# Patient Record
Sex: Female | Born: 1960 | Race: White | Hispanic: No | Smoking: Former smoker
Health system: Southern US, Community
[De-identification: ages and names within clinical notes are randomized; demographics above are authoritative.]

## PROBLEM LIST (undated history)

## (undated) DIAGNOSIS — F32A Depression, unspecified: Secondary | ICD-10-CM

## (undated) DIAGNOSIS — D473 Essential (hemorrhagic) thrombocythemia: Secondary | ICD-10-CM

## (undated) DIAGNOSIS — K589 Irritable bowel syndrome without diarrhea: Secondary | ICD-10-CM

## (undated) DIAGNOSIS — F419 Anxiety disorder, unspecified: Secondary | ICD-10-CM

## (undated) DIAGNOSIS — I480 Paroxysmal atrial fibrillation: Secondary | ICD-10-CM

## (undated) DIAGNOSIS — I10 Essential (primary) hypertension: Secondary | ICD-10-CM

## (undated) DIAGNOSIS — K219 Gastro-esophageal reflux disease without esophagitis: Secondary | ICD-10-CM

## (undated) DIAGNOSIS — K439 Ventral hernia without obstruction or gangrene: Secondary | ICD-10-CM

## (undated) DIAGNOSIS — J45909 Unspecified asthma, uncomplicated: Secondary | ICD-10-CM

## (undated) DIAGNOSIS — D75839 Thrombocytosis, unspecified: Secondary | ICD-10-CM

## (undated) DIAGNOSIS — F329 Major depressive disorder, single episode, unspecified: Secondary | ICD-10-CM

## (undated) DIAGNOSIS — K5792 Diverticulitis of intestine, part unspecified, without perforation or abscess without bleeding: Secondary | ICD-10-CM

## (undated) DIAGNOSIS — C642 Malignant neoplasm of left kidney, except renal pelvis: Secondary | ICD-10-CM

## (undated) HISTORY — PX: WISDOM TOOTH EXTRACTION: SHX21

## (undated) HISTORY — PX: LYMPH GLAND EXCISION: SHX13

## (undated) HISTORY — DX: Thrombocytosis, unspecified: D75.839

## (undated) HISTORY — PX: TUBAL LIGATION: SHX77

## (undated) HISTORY — DX: Essential (hemorrhagic) thrombocythemia: D47.3

## (undated) HISTORY — PX: REVISION OF SCAR: SHX2348

## (undated) HISTORY — DX: Malignant neoplasm of left kidney, except renal pelvis: C64.2

## (undated) HISTORY — DX: Paroxysmal atrial fibrillation: I48.0

---
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
## 1998-09-22 ENCOUNTER — Other Ambulatory Visit: Admission: RE | Admit: 1998-09-22 | Discharge: 1998-09-22 | Payer: Self-pay | Admitting: *Deleted

## 1999-06-11 ENCOUNTER — Encounter: Payer: Self-pay | Admitting: Emergency Medicine

## 1999-06-11 ENCOUNTER — Emergency Department (HOSPITAL_COMMUNITY): Admission: EM | Admit: 1999-06-11 | Discharge: 1999-06-11 | Payer: Self-pay | Admitting: *Deleted

## 1999-10-31 ENCOUNTER — Other Ambulatory Visit: Admission: RE | Admit: 1999-10-31 | Discharge: 1999-10-31 | Payer: Self-pay | Admitting: *Deleted

## 2000-02-02 ENCOUNTER — Encounter: Payer: Self-pay | Admitting: Family Medicine

## 2000-02-02 ENCOUNTER — Encounter: Admission: RE | Admit: 2000-02-02 | Discharge: 2000-02-02 | Payer: Self-pay | Admitting: Family Medicine

## 2001-01-02 ENCOUNTER — Other Ambulatory Visit: Admission: RE | Admit: 2001-01-02 | Discharge: 2001-01-02 | Payer: Self-pay | Admitting: *Deleted

## 2001-07-22 ENCOUNTER — Other Ambulatory Visit: Admission: RE | Admit: 2001-07-22 | Discharge: 2001-07-22 | Payer: Self-pay | Admitting: *Deleted

## 2001-08-03 ENCOUNTER — Emergency Department (HOSPITAL_COMMUNITY): Admission: EM | Admit: 2001-08-03 | Discharge: 2001-08-03 | Payer: Self-pay | Admitting: Emergency Medicine

## 2002-12-22 ENCOUNTER — Other Ambulatory Visit: Admission: RE | Admit: 2002-12-22 | Discharge: 2002-12-22 | Payer: Self-pay | Admitting: *Deleted

## 2005-01-26 ENCOUNTER — Other Ambulatory Visit: Admission: RE | Admit: 2005-01-26 | Discharge: 2005-01-26 | Payer: Self-pay | Admitting: Obstetrics and Gynecology

## 2005-12-05 ENCOUNTER — Encounter: Admission: RE | Admit: 2005-12-05 | Discharge: 2005-12-05 | Payer: Self-pay | Admitting: Obstetrics and Gynecology

## 2006-05-17 ENCOUNTER — Encounter (INDEPENDENT_AMBULATORY_CARE_PROVIDER_SITE_OTHER): Payer: Self-pay | Admitting: Specialist

## 2006-05-17 ENCOUNTER — Ambulatory Visit (HOSPITAL_COMMUNITY): Admission: RE | Admit: 2006-05-17 | Discharge: 2006-05-17 | Payer: Self-pay | Admitting: Obstetrics and Gynecology

## 2008-07-18 ENCOUNTER — Emergency Department (HOSPITAL_COMMUNITY): Admission: EM | Admit: 2008-07-18 | Discharge: 2008-07-18 | Payer: Self-pay | Admitting: Emergency Medicine

## 2010-12-23 NOTE — H&P (Signed)
NAMEARDELIA, Natalie Cobb                ACCOUNT NO.:  [id number]   MEDICAL RECORD NO.:  [id number]          PATIENT TYPE:  AMB   LOCATION:  SDC                           FACILITY:  [facility]   PHYSICIAN:  Guy Sandifer. Henderson Cloud, M.D. DATE OF BIRTH:  Oct 02, 1960   DATE OF ADMISSION:  05/17/2006  DATE OF DISCHARGE:                                HISTORY & PHYSICAL   CHIEF COMPLAINT:  Pelvic pain, irregular heavy menses.  Desires permanent  sterilization.   HISTORY OF PRESENT ILLNESS:  This patient is a 50 year old divorced white  female, G5, P2, having menses every 2-6 weeks.  She has right lower quadrant  pain almost every day, sometimes sharp and sometimes bothering her quite a  bit.  This has happened several years ago and has now been recurrent for the  last several weeks.  She also desires permanent sterilization.  Alternate  methods of contraception have been reviewed.  The patient is being admitted  for open laparoscopy, tubal ligation, hysteroscopy, D&C, possible  resectoscope.  Potential risks and complications have been reviewed  preoperatively.  The patient declined preoperative ultrasonography.   PAST MEDICAL HISTORY:  1. Irregular heartbeat.  2. Panic disorder.  3. 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  4. Chronic hypertension.   PAST SURGICAL HISTORY:  1. 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  2. Abdominal plastic procedure for lipoma.  3. Tonsillectomy.   OBSTETRIC HISTORY:  Vaginal delivery x2, miscarriage x3.   FAMILY HISTORY:  Heart attack in paternal grandfather, heart disease  paternal grandfather, brain tumor paternal grandmother, stroke in mother,  pernicious anemia in mother, hypothyroidism in mother.   MEDICATIONS:  1. Wellbutrin.  2. Prozac.  3. Nexium.  4. Hydrochlorothiazide.  5. Clonazepam.  6. Soma.   ALLERGIES:  COMPAZINE LEADING TO MUSCLE TWITCHING AND SULFA DRUGS LEADING TO  RASH.   SOCIAL HISTORY:  Denies tobacco, alcohol and drug abuse.   REVIEW OF  SYSTEMS:  NEUROLOGIC:  Denies headache.  CARDIOVASCULAR:  Denies  chest pain.  PULMONARY:  Denies shortness of breath.  GI:  Denies recent  changes in bowel habits.   PHYSICAL EXAMINATION:  VITAL SIGNS:  Height 5 feet, 3 inches, weight 151.8  pounds, blood pressure 120/78.  LUNGS:  Clear to auscultation.  HEART:  Regular rate and rhythm.  BACK:  Without CVA tenderness.  BREASTS:  Without mass, retraction or discharge.  ABDOMEN:  Soft and nontender without masses.  PELVIC:  Vulva, vagina and cervix without lesion.  The uterus is mobile and  nontender.  Left adnexa nontender without masses.  Right adnexa mildly  tender without palpable masses.  EXTREMITIES:  Grossly within normal limits.  NEUROLOGICAL:  Grossly within normal limits.   ASSESSMENT:  1. Pelvic pain.  2. Menometrorrhagia.  3. Desires permanent sterilization.   PLAN:  Open laparoscopy, bilateral tubal ligation, hysteroscopy, possible  resectoscope, dilatation and curettage.      Guy Sandifer Henderson Cloud, M.D.  Electronically Signed     JET/MEDQ  D:  05/11/2006  T:  05/11/2006  Job:  [id number]

## 2010-12-23 NOTE — Op Note (Signed)
Natalie Cobb, Natalie Cobb                ACCOUNT NO.:  [id number]   MEDICAL RECORD NO.:  [id number]          PATIENT TYPE:  AMB   LOCATION:  [facility]C                           FACILITY:  [facility]   PHYSICIAN:  Guy Sandifer. Henderson Cloud, M.D. DATE OF BIRTH:  [birth year]   DATE OF PROCEDURE:  05/17/2006  DATE OF DISCHARGE:                                 OPERATIVE REPORT   PREOPERATIVE DIAGNOSIS:  Menorrhagia, pelvic pain, desires permanent  sterilization.   POSTOPERATIVE DIAGNOSIS:  Menorrhagia, pelvic pain, desires permanent  sterilization.   PROCEDURE:  Laparoscopy with bilateral tubal ligation with Filshie clips,  lysis of adhesions, hysteroscopy, dilatation and curettage, 1% Xylocaine  paracervical block.   SURGEON:  Guy Sandifer. Henderson Cloud, M.D.   ANESTHESIA:  General endotracheal intubation.   SPECIMENS:  Endometrial curettings.   INPUT/OUTPUT:  Sorbitol distending media, 65 cc deficit with some of that  being on the floor.   ESTIMATED BLOOD LOSS:  Less than 50 cc.   INDICATIONS/CONSENT:  This patient is a 50 year old divorced white female,  G5, P2, with frequent heavy menses, pelvic pain, and desires permanent  sterilization.  Details have been dictated in the history and physical.  Laparoscopy, tubal ligation with Filshie clips, hysteroscopy, dilatation and  curettage has been discussed preoperatively.  Potential risks and  complications have been discussed preoperatively, including but not limited  to infection, bowel/bladder/ureteral damage, bleeding requiring transfusion  of blood products, possible transfusion reaction, HIV and hepatitis  acquisition, DVT/PE, pneumonia, recurrent pelvic pain, or irregular heavy  bleeding, the permanence of tubal ligation, contraceptive alternatives,  failure rate, and increased ectopic risk have also been reviewed.  All  questions are answered, and consent is signed on the chart.   FINDINGS:  There is an 8 mm polypoid-type mass on a narrow stalk at the very  top of the endocervical canal. The remainder of the endometrial cavity is  without abnormal structure.  Fallopian tube ostia are identified  bilaterally.  Abdominal:  The upper abdomen appears grossly normal.  The  uterus is 4-6 weeks in size.  Fallopian tubes are normal bilaterally.  The  right ovary has a single dense adhesion to the right pelvic side wall.  The  left ovary is very small.  Anterior posterior cul de sac is normal.   PROCEDURE:  Patient is taken to the operating room where she is identified,  placed in the dorsal supine position.  General anesthesia is induced via  endotracheal intubation.  She is then placed in the dorsal lithotomy  position, where she is prepped abdominally and vaginally.  The bladder is  straight-catheterized, and she is draped in a sterile fashion.  A bivalve  speculum is placed in the vagina, and the anterior cervical lip is injected  with 1% Xylocaine.  It is then grasped with a single-tooth tenaculum.  Paracervical block is placed at the 2, 4, 5, 7, 8, and 10 o'clock positions  with approximately 20 cc total 1% plain Xylocaine.  The cervix is gently  progressively dilated with a 27 dilator.  Diagnostic hysteroscope is placed  in the endocervical canal  and advanced under direct visualization using  sorbitol-distending medium.  The above findings are noted.  Hysteroscope is  withdrawn.  Sharp curettage is carried out.  The polyp is noted to be  obtained with this.  The hysteroscope is then reintroduced in the  endocervical canal, and careful inspection reveals that the polyp was  removed.  The hysteroscope is then removed.  The single-tooth tenaculum is  then replaced with a Hulka tenaculum, and attention is turned to the  abdomen.  The infraumbilical and suprapubic area is injected with 0.5% plain  Marcaine.  An infraumbilical incision is made.  Careful blunt dissection is  carried out to the fascia.  The fascia is grasped with Kocher clamps and   elevated.  It is carefully incised in the midline, and a small incision is  made.  The peritoneal cavity is carefully entered without difficulty.  This  is elevated well away from the bowel.  A 0 Vicryl suture is placed at each  angle of the incision at this point in the fascia.  The disposable Hasson  laparoscope is then placed in the peritoneal cavity and tied down with the 0  Vicryl sutures.  Pneumoperitoneum is induced, and the operative laparoscope  is placed.  The above findings are noted.  The operative laparoscope is  placed.  The above findings are noted.  A small suprapubic incision is made,  and a 5 mm disposable bladeless XL trocar sleeve is placed under direct  visualization without difficulty.  The above findings are noted.  The  adhesion of the right ovary is taken down sharply in a simple fashion.  Good  hemostasis is noted.  The right fallopian tube is identified from cornua to  fimbria.  A Filshie clip is placed on the proximal one-third of the tube.  A  similar procedure is carried out on the left tube.  After removing the  Filshie clip applicator, reinspection reveals the entire width of the  fallopian tube is within the clip.  The heel of the clip could be seen  through the mesosalpinx bilaterally.  Good hemostasis is noted all around.  Suprapubic trocar sleeve is removed, and the Hasson laparoscopic trocar  sleeve is also removed.  Then under excellent visualization, an elevation of  the fascia, well away from the underlying structures, the fascia is closed  with the 0 Vicryl suture.  The skin is closed with subcuticular 3-0 Vicryl  suture.  Dermabond is placed on both incisions.  Hulka tenaculum is removed.  Good hemostasis is noted.  All counts are correct.  Patient is awakened and  taken to the recovery room in stable condition.      Guy Sandifer Henderson Cloud, M.D.  Electronically Signed    JET/MEDQ  D:  05/17/2006  T:  05/18/2006  Job:  [id number]

## 2013-06-08 ENCOUNTER — Emergency Department (HOSPITAL_COMMUNITY)
Admission: EM | Admit: 2013-06-08 | Discharge: 2013-06-08 | Disposition: A | Discharge status: Home / Self Care | Payer: BC Managed Care – PPO | Attending: Emergency Medicine | Admitting: Emergency Medicine

## 2013-06-08 ENCOUNTER — Encounter (HOSPITAL_COMMUNITY): Payer: Self-pay | Admitting: Emergency Medicine

## 2013-06-08 DIAGNOSIS — Z79899 Other long term (current) drug therapy: Secondary | ICD-10-CM | POA: Insufficient documentation

## 2013-06-08 DIAGNOSIS — I10 Essential (primary) hypertension: Secondary | ICD-10-CM | POA: Insufficient documentation

## 2013-06-08 DIAGNOSIS — R040 Epistaxis: Secondary | ICD-10-CM | POA: Insufficient documentation

## 2013-06-08 DIAGNOSIS — Z8719 Personal history of other diseases of the digestive system: Secondary | ICD-10-CM | POA: Insufficient documentation

## 2013-06-08 DIAGNOSIS — Z87891 Personal history of nicotine dependence: Secondary | ICD-10-CM | POA: Insufficient documentation

## 2013-06-08 HISTORY — DX: Diverticulitis of intestine, part unspecified, without perforation or abscess without bleeding: K57.92

## 2013-06-08 HISTORY — DX: Essential (primary) hypertension: I10

## 2013-06-08 MED ORDER — PHENYLEPHRINE HCL 0.5 % NA SOLN
2.0000 [drp] | Freq: Once | NASAL | Status: DC
  Filled 2013-06-08: qty 15

## 2013-06-08 NOTE — ED Notes (Signed)
Pt's nose bleed has stopped. Multiple clots coughed up by pt. Pt shows no sign of distress at this time.

## 2013-06-08 NOTE — ED Provider Notes (Signed)
CSN: [id number]     Arrival date & time 06/08/13  1858 History   First MD Initiated Contact with Patient 06/08/13 2034     Chief Complaint  Patient presents with  . Hypertension  . Epistaxis    HPI Pt was seen at 2135. Per pt, c/o gradual onset and improvement in constant nosebleed that began approx 45 minutes PTA. Pt states she held pressure on her nose but was unable to stop the bleeding at home. Pt states she "turned the heat on in the house" for the past few days. Pt denies taking anticoagulants. Denies hx of excessive/easy bleeding or bruising. Denies trauma. Denies syncope, no CP/SOB.    Past Medical History  Diagnosis Date  . Hypertension   . Diverticulitis    Past Surgical History  Procedure Laterality Date  . Nephectomy    . Spleenectomy    . Tubal ligation      History  Substance Use Topics  . Smoking status: Former Games developer  . Smokeless tobacco: Not on file  . Alcohol Use: Yes     Comment: rarely    Review of Systems ROS: Statement: All systems negative except as marked or noted in the HPI; Constitutional: Negative for fever and chills. ; ; Eyes: Negative for eye pain, redness and discharge. ; ; ENMT: +nosebleed. Negative for ear pain, hoarseness, nasal congestion, sinus pressure and sore throat. ; ; Cardiovascular: Negative for chest pain, palpitations, diaphoresis, dyspnea and peripheral edema. ; ; Respiratory: Negative for cough, wheezing and stridor. ; ; Gastrointestinal: Negative for nausea, vomiting, diarrhea, abdominal pain, blood in stool, hematemesis, jaundice and rectal bleeding. . ; ; Genitourinary: Negative for dysuria, flank pain and hematuria. ; ; Musculoskeletal: Negative for back pain and neck pain. Negative for swelling and trauma.; ; Skin: Negative for pruritus, rash, abrasions, blisters, bruising and skin lesion.; ; Neuro: Negative for headache, lightheadedness and neck stiffness. Negative for weakness, altered level of consciousness , altered mental  status, extremity weakness, paresthesias, involuntary movement, seizure and syncope.       Allergies  Compazine; Meclizine; Sulfa antibiotics; and Thorazine  Home Medications   Current Outpatient Rx  Name  Route  Sig  Dispense  Refill  . albuterol (PROAIR HFA) 108 (90 BASE) MCG/ACT inhaler   Inhalation   Inhale 2 puffs into the lungs every 6 (six) hours as needed for wheezing or shortness of breath.         Marland Kitchen buPROPion (WELLBUTRIN XL) 150 MG 24 hr tablet   Oral   Take 150 mg by mouth every evening.         . Cholecalciferol (VITAMIN D-3) 5000 UNITS TABS   Oral   Take by mouth every evening.         . clonazePAM (KLONOPIN) 1 MG tablet   Oral   Take 1 mg by mouth 2 (two) times daily.         Marland Kitchen dicyclomine (BENTYL) 20 MG tablet   Oral   Take 20 mg by mouth daily as needed (for stomach pain).         . fish oil-omega-3 fatty acids 1000 MG capsule   Oral   Take 2 g by mouth every evening.         Marland Kitchen FLUoxetine (PROZAC) 10 MG tablet   Oral   Take 10 mg by mouth every evening.         Marland Kitchen ibuprofen (ADVIL,MOTRIN) 200 MG tablet   Oral   Take 400 mg by  mouth every 6 (six) hours as needed for pain.         . metoprolol tartrate (LOPRESSOR) 25 MG tablet   Oral   Take 25 mg by mouth 2 (two) times daily.         . pantoprazole (PROTONIX) 40 MG tablet   Oral   Take 40 mg by mouth 2 (two) times daily.         . simvastatin (ZOCOR) 40 MG tablet   Oral   Take 40 mg by mouth every evening.          BP 156/77  Pulse 71  Temp(Src) 97.7 F (36.5 C) (Oral)  Resp 18  Ht 5\' 3"  (1.6 m)  Wt 190 lb (86.183 kg)  BMI 33.67 kg/m2  SpO2 97% Physical Exam 2140: Physical examination:  Nursing notes reviewed; Vital signs and O2 SAT reviewed;  Constitutional: Well developed, Well nourished, Well hydrated, In no acute distress; Head:  Normocephalic, atraumatic; Eyes: EOMI, PERRL, No scleral icterus; ENMT: TM's clear bilat. No active epistaxis. +dried blood right  anterior nares. Nasal mucosa friable. No blood in posterior pharynx. Mouth and pharynx normal, Mucous membranes moist; Neck: Supple, Full range of motion, No lymphadenopathy; Cardiovascular: Regular rate and rhythm, No murmur, rub, or gallop; Respiratory: Breath sounds clear & equal bilaterally, No rales, rhonchi, wheezes.  Speaking full sentences with ease, Normal respiratory effort/excursion; Chest: Nontender, Movement normal; Abdomen: Soft, Nontender, Nondistended, Normal bowel sounds; Genitourinary: No CVA tenderness; Extremities: Pulses normal, No tenderness, No edema, No calf edema or asymmetry.; Neuro: AA&Ox3, Major CN grossly intact.  Speech clear. Climbs on and off stretcher easily by herself. Gait steady.  No gross focal motor or sensory deficits in extremities.; Skin: Color normal, Warm, Dry.   ED Course  Procedures   EKG Interpretation   None       MDM  MDM Reviewed: previous chart, nursing note and vitals Interpretation: labs     I-stat chem: Na 141, K 4.1, Cl 103, iCa 1.26, CO2 25, Glu 101, BUN 13, Cr 0.8, Hgb 13.9, Hct 41.   2230:  No further epistaxis since arrival to the ED. Pt states she wants to go home now. Dx and testing d/w pt and family.  Questions answered.  Verb understanding, agreeable to d/c home with outpt f/u.     Laray Anger, DO 06/11/13 1301

## 2013-06-08 NOTE — ED Notes (Signed)
Pt presents with hypertension and epitaxies x 45 min.  Denies pain at this time.

## 2013-06-08 NOTE — ED Notes (Signed)
Pt presents via EMS secondary to nose bleed and hypertension. Pt reports nose has been bleeding x 45 min, unable to stop the bleeding. Pt is holding pressure on nose.  Pt reports has been coughing up a lot of clots since nose bleeding started.  No active bleeding noted at this time.

## 2013-06-09 LAB — POCT I-STAT, CHEM 8
Chloride: 103 mEq/L (ref 96–112)
Creatinine, Ser: 0.8 mg/dL (ref 0.50–1.10)
HCT: 41 % (ref 36.0–46.0)
Hemoglobin: 13.9 g/dL (ref 12.0–15.0)
Potassium: 4.1 mEq/L (ref 3.5–5.1)
Sodium: 141 mEq/L (ref 135–145)
TCO2: 25 mmol/L (ref 0–100)

## 2013-10-15 ENCOUNTER — Emergency Department (HOSPITAL_COMMUNITY): Payer: BC Managed Care – PPO

## 2013-10-15 ENCOUNTER — Encounter (HOSPITAL_COMMUNITY): Payer: Self-pay | Admitting: Emergency Medicine

## 2013-10-15 ENCOUNTER — Emergency Department (HOSPITAL_COMMUNITY)
Admission: EM | Admit: 2013-10-15 | Discharge: 2013-10-15 | Disposition: A | Discharge status: Home / Self Care | Payer: BC Managed Care – PPO | Attending: Emergency Medicine | Admitting: Emergency Medicine

## 2013-10-15 DIAGNOSIS — W1809XA Striking against other object with subsequent fall, initial encounter: Secondary | ICD-10-CM | POA: Insufficient documentation

## 2013-10-15 DIAGNOSIS — W108XXA Fall (on) (from) other stairs and steps, initial encounter: Secondary | ICD-10-CM | POA: Insufficient documentation

## 2013-10-15 DIAGNOSIS — S82851A Displaced trimalleolar fracture of right lower leg, initial encounter for closed fracture: Secondary | ICD-10-CM

## 2013-10-15 DIAGNOSIS — Z7982 Long term (current) use of aspirin: Secondary | ICD-10-CM | POA: Insufficient documentation

## 2013-10-15 DIAGNOSIS — Z8719 Personal history of other diseases of the digestive system: Secondary | ICD-10-CM | POA: Insufficient documentation

## 2013-10-15 DIAGNOSIS — Y9389 Activity, other specified: Secondary | ICD-10-CM | POA: Insufficient documentation

## 2013-10-15 DIAGNOSIS — I10 Essential (primary) hypertension: Secondary | ICD-10-CM | POA: Insufficient documentation

## 2013-10-15 DIAGNOSIS — S0990XA Unspecified injury of head, initial encounter: Secondary | ICD-10-CM | POA: Insufficient documentation

## 2013-10-15 DIAGNOSIS — Z87891 Personal history of nicotine dependence: Secondary | ICD-10-CM | POA: Insufficient documentation

## 2013-10-15 DIAGNOSIS — Z79899 Other long term (current) drug therapy: Secondary | ICD-10-CM | POA: Insufficient documentation

## 2013-10-15 DIAGNOSIS — E669 Obesity, unspecified: Secondary | ICD-10-CM | POA: Insufficient documentation

## 2013-10-15 DIAGNOSIS — S82853A Displaced trimalleolar fracture of unspecified lower leg, initial encounter for closed fracture: Secondary | ICD-10-CM | POA: Insufficient documentation

## 2013-10-15 DIAGNOSIS — Y929 Unspecified place or not applicable: Secondary | ICD-10-CM | POA: Insufficient documentation

## 2013-10-15 MED ORDER — PROPOFOL 10 MG/ML IV BOLUS
INTRAVENOUS | Status: AC
  Filled 2013-10-15: qty 20

## 2013-10-15 MED ORDER — PROPOFOL 10 MG/ML IV BOLUS
50.0000 mg | Freq: Once | INTRAVENOUS | Status: AC
  Administered 2013-10-15: 50 mg via INTRAVENOUS
  Filled 2013-10-15: qty 20

## 2013-10-15 MED ORDER — HYDROMORPHONE HCL PF 1 MG/ML IJ SOLN
1.0000 mg | Freq: Once | INTRAMUSCULAR | Status: AC
  Administered 2013-10-15: 1 mg via INTRAVENOUS
  Filled 2013-10-15: qty 1

## 2013-10-15 MED ORDER — ONDANSETRON HCL 4 MG/2ML IJ SOLN
4.0000 mg | Freq: Once | INTRAMUSCULAR | Status: AC
  Administered 2013-10-15: 4 mg via INTRAVENOUS
  Filled 2013-10-15: qty 2

## 2013-10-15 MED ORDER — FENTANYL CITRATE 0.05 MG/ML IJ SOLN
50.0000 ug | Freq: Once | INTRAMUSCULAR | Status: AC
  Administered 2013-10-15: 50 ug via INTRAVENOUS
  Filled 2013-10-15: qty 2

## 2013-10-15 MED ORDER — OXYCODONE-ACETAMINOPHEN 5-325 MG PO TABS: 2.0000 | ORAL_TABLET | ORAL | Status: DC | PRN

## 2013-10-15 MED ORDER — PROPOFOL 10 MG/ML IV BOLUS
INTRAVENOUS | Status: AC | PRN
  Administered 2013-10-15: 50 ug via INTRAVENOUS

## 2013-10-15 NOTE — ED Provider Notes (Signed)
CSN: [id number]     Arrival date & time 10/15/13  1350 History   First MD Initiated Contact with Patient 10/15/13 1433     Chief Complaint  Patient presents with  . Fall  . Ankle Injury     (Consider location/radiation/quality/duration/timing/severity/associated sxs/prior Treatment) HPI  53 year old female with history of hypertension who presents for evaluation of recent fall and right ankle injury. Patient reports she was vacuuming the stairs, missed step, fell backward down 5 steps hitting her head against a hard wood floor. Chief complaint of acute onset of pain to her right ankle, unable to move the ankle. She noticed obvious dislocation to R ankle. Incident happened about 2 hours ago. She was brought here via EMS on spine board and c-collar. She received 10 mg of morphine and 4 mg of Zofran  prior to arrival. She was found to be neurovascularly intact on initial exam by EMS with intact pulses and sensation to the right ankle and foot. She complains of mild tenderness to the back of her head but denies any shoulder, chest, back, abdomen, hip, or knee pain. The pain is currently 10 out of 10. She denies any prior injury to the same ankle. She denies any recent alcohol use. She denies any loss of consciousness or any precipitating symptoms prior to fall.  Past Medical History  Diagnosis Date  . Hypertension   . Diverticulitis    Past Surgical History  Procedure Laterality Date  . Nephectomy    . Spleenectomy    . Tubal ligation     History reviewed. No pertinent family history. History  Substance Use Topics  . Smoking status: Former Research scientist (life sciences)  . Smokeless tobacco: Not on file  . Alcohol Use: Yes     Comment: rarely   OB History   Grav Para Term Preterm Abortions TAB SAB Ect Mult Living                 Review of Systems  Constitutional: Negative for fever.  Musculoskeletal: Positive for arthralgias.  Skin: Negative for rash and wound.  Neurological: Negative for numbness.       Allergies  Compazine; Meclizine; Sulfa antibiotics; and Thorazine  Home Medications   Current Outpatient Rx  Name  Route  Sig  Dispense  Refill  . albuterol (PROAIR HFA) 108 (90 BASE) MCG/ACT inhaler   Inhalation   Inhale 2 puffs into the lungs every 6 (six) hours as needed for wheezing or shortness of breath.         Marland Kitchen amitriptyline (ELAVIL) 10 MG tablet   Oral   Take 10 mg by mouth 2 (two) times daily.         Marland Kitchen aspirin EC 81 MG tablet   Oral   Take 81 mg by mouth daily.         Marland Kitchen buPROPion (WELLBUTRIN XL) 150 MG 24 hr tablet   Oral   Take 150 mg by mouth every evening.         . Cholecalciferol (VITAMIN D-3) 5000 UNITS TABS   Oral   Take by mouth every evening.         . clonazePAM (KLONOPIN) 1 MG tablet   Oral   Take 1 mg by mouth 2 (two) times daily.         Marland Kitchen dicyclomine (BENTYL) 20 MG tablet   Oral   Take 20 mg by mouth daily as needed (for stomach pain).         . fish  oil-omega-3 fatty acids 1000 MG capsule   Oral   Take 2 g by mouth every evening.         Marland Kitchen ibuprofen (ADVIL,MOTRIN) 200 MG tablet   Oral   Take 400 mg by mouth every 6 (six) hours as needed for pain.         . metoprolol tartrate (LOPRESSOR) 25 MG tablet   Oral   Take 25 mg by mouth 2 (two) times daily.         . pantoprazole (PROTONIX) 40 MG tablet   Oral   Take 40 mg by mouth 2 (two) times daily.         . simvastatin (ZOCOR) 40 MG tablet   Oral   Take 40 mg by mouth every evening.          BP 154/99  Pulse 71  Temp(Src) 97.8 F (36.6 C) (Oral)  Resp 14  Ht 5\' 3"  (1.6 m)  Wt 200 lb (90.719 kg)  BMI 35.44 kg/m2  SpO2 97% Physical Exam  Nursing note and vitals reviewed. Constitutional: She is oriented to person, place, and time. She appears well-developed and well-nourished. She appears distressed (uncomfortable appearing, in pain).  Moderately obese  HENT:  No scalp tenderness or laceration noted. No hemotympanum, no septal hematoma, no  malocclusion, no midface tenderness  Neck:   c-collar in place, no midline cervical spine tenderness, crepitus, or step-off noted  Cardiovascular: Normal rate, regular rhythm and intact distal pulses.   Pulmonary/Chest: Effort normal. She exhibits no tenderness.  Abdominal: Soft. There is no tenderness.  Musculoskeletal: She exhibits tenderness (R ankle: obvious deformity with foot shifted laterally with tenderness to medial/lateral/posterior malleolar region, intact sensation, brisk cap refill, intact dorsallis pedis and posterior tibialis pulses.). She exhibits no edema.  Soft splint in place on RLE: Joint laxity to R ankle. Skin tenting noted to medial malleolar region.  Neurological: She is alert and oriented to person, place, and time.  Skin: Skin is warm. No rash noted.  Psychiatric: She has a normal mood and affect.    ED Course  Reduction of dislocation Date/Time: 10/15/2013 4:51 PM Performed by: Domenic Moras Authorized by: Domenic Moras Consent: Verbal consent obtained. written consent obtained. Risks and benefits: risks, benefits and alternatives were discussed Consent given by: patient Patient understanding: patient states understanding of the procedure being performed Patient consent: the patient's understanding of the procedure matches consent given Procedure consent: procedure consent matches procedure scheduled Relevant documents: relevant documents present and verified Test results: test results available and properly labeled Site marked: the operative site was marked Imaging studies: imaging studies available Patient identity confirmed: verbally with patient and arm band Time out: Immediately prior to procedure a "time out" was called to verify the correct patient, procedure, equipment, support staff and site/side marked as required. Local anesthesia used: no Patient sedated: yes Sedatives: propofol Analgesia: hydromorphone Sedation start date/time: 10/15/2013 4:32  PM Sedation end date/time: 10/15/2013 4:45 PM Vitals: Vital signs were monitored during sedation. Patient tolerance: Patient tolerated the procedure well with no immediate complications. Comments: Reduction using traction/counter traction and repositioning.  Posterior and stirrup splint applied.     (including critical care time)  2:50 PM Pt has mechanical fall and suffered a suspected fracture dislocation of R ankle.  Is neurovascularly intact on exam.  No LOC, no scalp tenderness and no significant midline spine tenderness.  This patient does have a distracting injury I cannot clear C-spine using the NEXUS criteria. Will obtain head neck  CT scan.  3:35 PM i have consulted orthopedic surgeon Dr. Erlinda Hong, who will review pt's imaging.  Pt will likely need ORIF. We will attempt to reduce R ankle with conscious sedation.  Care discussed with Dr. Doy Mince  4:42 PM R ankle was successfully reduced with conscious sedation under the guidance of Dr. Reather Converse.  Pt is neurovascularly intact post reduction.  Will check postreduction xray and will consult Dr. Erlinda Hong.  Will order basic labs.    4:53 PM Dr. Erlinda Hong has been consulted who request to have pt f/u in his office tomorrow for further management.  Care discussed with Dr. Reather Converse who will dispo pt once imaging resulted.      Labs Review Labs Reviewed - No data to display Imaging Review Dg Knee 1-2 Views Right  10/15/2013   CLINICAL DATA Right ankle deformity, fall  EXAM RIGHT KNEE - 1-2 VIEW  COMPARISON None  FINDINGS AP and cross-table lateral views.  Diffuse osseous demineralization.  Minimal joint space narrowing right knee.  No acute fracture, dislocation or bone destruction.  No knee joint effusion.  IMPRESSION No acute osseous abnormalities.  SIGNATURE  Electronically Signed   By: Lavonia Dana M.D.   On: 10/15/2013 15:32   Dg Ankle 2 Views Right  10/15/2013   CLINICAL DATA Postreduction  EXAM RIGHT ANKLE - 2 VIEW  COMPARISON Radiography from today at 3:20  p.m.  FINDINGS Relocated tibiotalar joint. Medial malleolar and distal fibular fractures are also better aligned. The medial malleolar fracture remains displaced 50%, with widening of the ankle mortise. No new osseous abnormality seen.  IMPRESSION Relocated tibiotalar joint. Improved distal fibula and tibia fracture alignment.  SIGNATURE  Electronically Signed   By: Jorje Guild M.D.   On: 10/15/2013 18:09   Dg Ankle 2 Views Right  10/15/2013   CLINICAL DATA Right ankle deformity post fall today  EXAM RIGHT ANKLE - 2 VIEW  COMPARISON None  FINDINGS AP and cross-table lateral views.  Medial malleolar fracture with significant lateral displacement.  Oblique distal right fibular metadiaphyseal fracture with significant posterior and lateral displacement and apex medial and anterior angulation.  Lateral dislocation of the talus at the tibiotalar joint.  Questionable small posterior malleolar fracture on single view.  Bones appear demineralized.  Prior superior intact.  IMPRESSION Bimalleolar versus trimalleolar fracture-dislocation right ankle as above.  SIGNATURE  Electronically Signed   By: Lavonia Dana M.D.   On: 10/15/2013 15:34   Ct Head Wo Contrast  10/15/2013   CLINICAL DATA 53 year old who fell down approximately 5 stairs.  EXAM CT HEAD WITHOUT CONTRAST  CT CERVICAL SPINE WITHOUT CONTRAST  TECHNIQUE Multidetector CT imaging of the head and cervical spine was performed following the standard protocol without intravenous contrast. Multiplanar CT image reconstructions of the cervical spine were also generated.  COMPARISON CT head and cervical spine 07/18/2008.  FINDINGS CT HEAD FINDINGS  Ventricular system normal in size and appearance for age. No mass lesion. No midline shift. No acute hemorrhage or hematoma. No extra-axial fluid collections. No evidence of acute infarction. No focal brain parenchymal abnormalities.  No skull fractures or other focal osseous abnormalities involving the skull. Visualized  paranasal sinuses, bilateral mastoid air cells, and bilateral middle ear cavities well-aerated.  CT CERVICAL SPINE FINDINGS  No fractures identified involving the cervical spine. Slight reversal of the usual lordosis centered at C5-6. Anatomic alignment. No spinal stenosis. Disc space narrowing and endplate hypertrophic changes at C5-6 (severe), progressive since the prior examination. Facet joints intact throughout.  Coronal reformatted images demonstrate an intact craniocervical junction, intact C1-C2 articulation, intact dens, and intact lateral masses throughout. No gross disc protrusions on the soft tissue window images. Moderate bilateral foraminal stenoses at C5-6 due to uncinate hypertrophy.  IMPRESSION CT Head:  1.  Normal examination.  CT Cervical Spine:  1. No cervical spine fractures identified. 2. Severe degenerative disc disease and spondylosis at C5-6 with moderate bilateral foraminal stenoses at this level, progressive since 2009.  SIGNATURE  Electronically Signed   By: Evangeline Dakin M.D.   On: 10/15/2013 18:10     EKG Interpretation None      MDM   Final diagnoses:  Fall down stairs  Closed trimalleolar fracture of right ankle    BP 135/80  Pulse 67  Temp(Src) 97.8 F (36.6 C) (Oral)  Resp 17  Ht 5\' 3"  (1.6 m)  Wt 200 lb (90.719 kg)  BMI 35.44 kg/m2  SpO2 95%  I have reviewed nursing notes and vital signs. I personally reviewed the imaging tests through PACS system  I reviewed available ER/hospitalization records thought the EMR     Domenic Moras, PA-C 10/16/13 [place], PA-C 10/16/13 [place], PA-C 10/17/13 1507

## 2013-10-15 NOTE — ED Notes (Signed)
PA at bedside.

## 2013-10-15 NOTE — ED Notes (Signed)
Communication to EDP pain was unrelieved by prior meds.

## 2013-10-15 NOTE — ED Notes (Signed)
Communication with Ortho for Posterior and Stirrup Splint.

## 2013-10-15 NOTE — ED Notes (Signed)
53 yo female presents with Right Ankle deformity after vacuuming the stairs and fell backwards down 5 steps. Pt has no hx of LOC and is A/O x4. Right Ankle is positive for pulse and sensation. Deformity is evident but no break in the skin. Currently on Back board and stabilized. HX. 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 Received 4 mg Zofran and 10mg  total of Morphine via IV.   vITALS  146/81 HR 72 SAT 96%

## 2013-10-15 NOTE — ED Notes (Signed)
Pt off the floor. XRAY

## 2013-10-15 NOTE — Progress Notes (Signed)
Orthopedic Tech Progress Note Patient Details:  Natalie Cobb Apr 12, 1961 [id number]  Ortho Devices Type of Ortho Device: Crutches Ortho Device/Splint Interventions: Ordered;Adjustment   Braulio Bosch 10/15/2013, 6:29 PM

## 2013-10-15 NOTE — Discharge Instructions (Signed)
Please wear splint, use crutches, do not bear any weight on affected ankle and keep ankle elevated above your heart when sitting or lying to decrease swelling.  Take pain medication as prescribed.  Follow up with Dr. Erlinda Hong tomorrow morning by calling his office for appointment.  He is expecting your call.  Return to ER if you develop numbness or if you have other concerns.    Trimalleolar Fracture, Ankle, Adult, Displaced (ORIF) A trimalleolar fracture (break in bone) is three fractures in the lower bones of your leg that make up your ankle. These fractures are in the bone you feel as the bump on the outside of your ankle (fibula) and the bone that you feel as the bump on the inside of your ankle (tibia). Your fractures are displaced. This means the bones are not in their normal position and will not give a good result if they heal in that position. Because of this, surgery is required. This is called an open reduction and internal fixation (ORIF). Even with the best of care and perfect results this ankle may be more prone to be arthritis later due to damage of the cartilage lining the ankle joint which is not visible on x-ray. These fractures are diagnosed with x-rays. TREATMENT  You have fractures that would probably heal with disability, without surgery. Open reduction means that the area of the fracture is opened up to the vision of the surgeon and internal fixation means that a screw, pins or fixation device is used to hold the boney pieces in place. After surgery a short-leg cast or removable fracture boot is then applied from your toes to below your knee. This is generally left in place for about 5 to 6 weeks, during which time it is followed by your caregiver and x-rays may be taken to make sure the bones stay in place. LET YOUR CAREGIVER KNOW ABOUT:  Allergies  Medications taken including herbs, eye drops, over the counter medications, and creams  Use of steroids (by mouth or creams)  History of  bleeding or blood problems  A family history of anesthetic problems.  Previous problems with anesthetics or novocaine, including a family history of these problems.  Possibility of pregnancy, if this applies  History of blood clots (thrombophlebitis)  Previous surgery  Other health problems RISKS AND COMPLICATIONS All surgery is associated with risks. Some of these risks are:  Excessive bleeding  Infection  Post traumatic arthritis.  Failure to heal properly resulting in an unstable or arthritic ankle  Stiffness of ankle following repair  The need to have some of the hardware removed. BEFORE AND AFTER SURGERY Prior to surgery an IV (intravenous line connected to your vein for giving fluids) may be started and you will be given an anesthetic (this may be medicine and gas to make you go to sleep or you may receive a spinal anesthetic which will make your legs numb for the period of time necessary for your surgery). You may also be given a regional anesthetic such as a spinal or epidural block. After surgery, you will be taken to the recovery area where a nurse will monitor your progress. You may have a catheter (a long, narrow, hollow tube) in your bladder following surgery that helps you pass your water. If you stay in the hospital, when you are awake, stable, taking fluids well and without complications, you will be returned to your room. You will receive physical therapy and other care until you are doing well and  your caregiver feels it is safe for you to be transferred either to home or to an extended care facility. HOME CARE INSTRUCTIONS   You may resume normal diet and activities as directed or allowed.  Keep ice packs (a bag of ice wrapped in a towel) on the surgical area for twenty minutes, four times per day, for the first two days following surgery. Use the ice only if OK with your surgeon or caregiver.  Elevate your ankle above your heart as much as possible for the first  24-48 hours after the operation.  Change dressings if necessary or as directed.  If you have a plaster or fiberglass cast:  Do not try to scratch the skin under the cast using sharp or pointed objects.  Check the skin around the cast every day. You may put lotion on any red or sore areas.  Keep your cast dry and clean.  Do not put pressure on any part of your cast or splint until it is fully hardened.  Your cast or splint can be protected during bathing with a plastic bag. Do not lower the cast or splint into water.  Only take over-the-counter or prescription medicines for pain, discomfort, or fever as directed by your caregiver.  Use crutches as directed and do not exercise leg unless instructed.  These are not fractures to be taken lightly! If these bones become displaced and get out of position, it may eventually lead to arthritis and disability for the rest of your life. Problems often follow even the best of care. Follow the directions of your caregiver.  Keep appointments as directed.  Elevate your injured ankle above your heart as much as possible for the first 5-7 days.  If you are placed into a fracture boot after surgery only remove is as instructed by your caregiver. SEEK IMMEDIATE MEDICAL CARE IF:   Redness, swelling, numbness or increasing pain in the wound.  Pus coming from wound.  An unexplained oral temperature above 102 F (38.9 C), or as your caregiver suggests.  A bad smell coming from the wound or dressing.  A breaking open of the wound (edges not staying together) after sutures or staples have been removed.  Your skin or nails below the injury turn blue or gray, or feel cold or numb.  You develop severe pain under the cast or in your foot. Especially when someone else moves your toes. Follow all instructions given to you by your caregiver, make and keep follow up appointments, and use crutches as directed. If you do not have a window in your cast for  observing the wound, a discharge or minor bleeding may show up as a stain on the outside of your dressings, your cast or plaster splint. Report these findings to your caregiver. If you are given a fracture boot, minor bleeding on the dressing is common. This is not of major concern. Change the dressing as instructed by your caregiver. Document Released: 04/15/2002 Document Revised: 10/16/2011 Document Reviewed: 10/15/2007 [facility] Patient Information 2014 [place].

## 2013-10-15 NOTE — Progress Notes (Signed)
Orthopedic Tech Progress Note Patient Details:  Natalie Cobb [birth year] [id number]  Ortho Devices Type of Ortho Device: Stirrup splint;Post (short leg) splint;Ace wrap Ortho Device/Splint Interventions: Application   Cammer, Theodoro Parma 10/15/2013, 5:27 PM

## 2013-10-17 ENCOUNTER — Other Ambulatory Visit (HOSPITAL_COMMUNITY): Payer: Self-pay | Admitting: Orthopedic Surgery

## 2013-10-17 ENCOUNTER — Encounter (HOSPITAL_COMMUNITY): Payer: Self-pay | Admitting: *Deleted

## 2013-10-17 NOTE — ED Provider Notes (Signed)
Medical screening examination/treatment/procedure(s) were conducted as a shared visit with non-physician practitioner(s) or resident and myself. I personally evaluated the patient during the encounter and agree with the findings and plan unless otherwise indicated.  I have personally reviewed any xrays and/ or EKG's with the provider and I agree with interpretation.  Mechanical fall, slipped down 5 stairs injuring right ankle and head. No loc and no vomiting. No blood thinners. Mild headache. No unilateral weakness, chest, back or abd pain. Pain with rom right ankle. Deformity. Right ankle tender, skin tenting anterior, mild swelling, nv intact distal, eversion/ deformity to ankle joint, RRR, lungs clear, CNs intact, moves all ext equal except RLE due to pain.  With skin tenting/ ankle fx/ dislocation discussed r/ b of reduction.  Procedural sedation Performed by: Mariea Clonts Consent: Verbal consent obtained. Risks and benefits: risks, benefits and alternatives were discussed Required items: required blood products, implants, devices, and special equipment available Patient identity confirmed: arm band and provided demographic data Time out: Immediately prior to procedure a "time out" was called to verify the correct patient, procedure, equipment, support staff and site  Sedation type: moderate (conscious) sedation NPO time confirmed, risks discussed  Sedatives: propofol  Physician Time at Bedside: 15 min  Vitals: Vital signs were monitored during sedation. Cardiac Monitor, pulse oximeter Patient tolerance: Patient tolerated the procedure well with no immediate complications. Comments: Pt with uneventful recovered. Returned to pre-procedural sedation baseline  I personally was present for entire procedural sedation and assisted with reduction and splint placement.  SPLINT APPLICATION Authorized by: Mariea Clonts Consent: Verbal consent obtained. Risks and benefits: risks,  benefits and alternatives were discussed Consent given by: patient Splint applied by: myself and tech Location details: right ankle stirrup/ short posterior leg Splint type: orthoglass  Supplies used: ace wraps/ cotton Post-procedure: The splinted body part was neurovascularly unchanged following the procedure. Patient tolerance: Patient tolerated the procedure well with no immediate complications.  Pain improved in ED, fup with ortho discussed.    Mariea Clonts, MD 10/17/13 [phone]

## 2013-10-17 NOTE — Progress Notes (Signed)
10/17/13 1646  OBSTRUCTIVE SLEEP APNEA  Have you ever been diagnosed with sleep apnea through a sleep study? No  Do you snore loudly (loud enough to be heard through closed doors)?  1  Do you often feel tired, fatigued, or sleepy during the daytime? 1  Has anyone observed you stop breathing during your sleep? 1  Do you have, or are you being treated for high blood pressure? 1  BMI more than 35 kg/m2? 1  Age over 53 years old? 1  Gender: 0  Obstructive Sleep Apnea Score 6  Score 4 or greater  Results sent to PCP

## 2013-10-18 ENCOUNTER — Encounter (HOSPITAL_COMMUNITY)
Admission: RE | Disposition: A | Discharge status: Home / Self Care | Payer: Self-pay | Source: Ambulatory Visit | Attending: Orthopedic Surgery

## 2013-10-18 ENCOUNTER — Ambulatory Visit (HOSPITAL_COMMUNITY): Payer: BC Managed Care – PPO

## 2013-10-18 ENCOUNTER — Encounter (HOSPITAL_COMMUNITY): Payer: Self-pay | Admitting: *Deleted

## 2013-10-18 ENCOUNTER — Encounter (HOSPITAL_COMMUNITY): Payer: BC Managed Care – PPO | Admitting: Certified Registered Nurse Anesthetist

## 2013-10-18 ENCOUNTER — Ambulatory Visit (HOSPITAL_COMMUNITY): Payer: BC Managed Care – PPO | Admitting: Certified Registered Nurse Anesthetist

## 2013-10-18 ENCOUNTER — Ambulatory Visit (HOSPITAL_COMMUNITY)
Admission: RE | Admit: 2013-10-18 | Discharge: 2013-10-18 | Disposition: A | Discharge status: Home / Self Care | Payer: BC Managed Care – PPO | Source: Ambulatory Visit | Attending: Orthopedic Surgery | Admitting: Orthopedic Surgery

## 2013-10-18 DIAGNOSIS — K439 Ventral hernia without obstruction or gangrene: Secondary | ICD-10-CM | POA: Insufficient documentation

## 2013-10-18 DIAGNOSIS — W010XXA Fall on same level from slipping, tripping and stumbling without subsequent striking against object, initial encounter: Secondary | ICD-10-CM | POA: Insufficient documentation

## 2013-10-18 DIAGNOSIS — F3289 Other specified depressive episodes: Secondary | ICD-10-CM | POA: Insufficient documentation

## 2013-10-18 DIAGNOSIS — S82853A Displaced trimalleolar fracture of unspecified lower leg, initial encounter for closed fracture: Secondary | ICD-10-CM | POA: Insufficient documentation

## 2013-10-18 DIAGNOSIS — J45909 Unspecified asthma, uncomplicated: Secondary | ICD-10-CM | POA: Insufficient documentation

## 2013-10-18 DIAGNOSIS — Z905 Acquired absence of kidney: Secondary | ICD-10-CM | POA: Insufficient documentation

## 2013-10-18 DIAGNOSIS — K589 Irritable bowel syndrome without diarrhea: Secondary | ICD-10-CM | POA: Insufficient documentation

## 2013-10-18 DIAGNOSIS — Z87891 Personal history of nicotine dependence: Secondary | ICD-10-CM | POA: Insufficient documentation

## 2013-10-18 DIAGNOSIS — Z7982 Long term (current) use of aspirin: Secondary | ICD-10-CM | POA: Insufficient documentation

## 2013-10-18 DIAGNOSIS — K219 Gastro-esophageal reflux disease without esophagitis: Secondary | ICD-10-CM | POA: Insufficient documentation

## 2013-10-18 DIAGNOSIS — I10 Essential (primary) hypertension: Secondary | ICD-10-CM | POA: Insufficient documentation

## 2013-10-18 DIAGNOSIS — F410 Panic disorder [episodic paroxysmal anxiety] without agoraphobia: Secondary | ICD-10-CM | POA: Insufficient documentation

## 2013-10-18 DIAGNOSIS — F329 Major depressive disorder, single episode, unspecified: Secondary | ICD-10-CM | POA: Insufficient documentation

## 2013-10-18 DIAGNOSIS — S82851A Displaced trimalleolar fracture of right lower leg, initial encounter for closed fracture: Secondary | ICD-10-CM

## 2013-10-18 HISTORY — DX: Irritable bowel syndrome, unspecified: K58.9

## 2013-10-18 HISTORY — DX: Major depressive disorder, single episode, unspecified: F32.9

## 2013-10-18 HISTORY — PX: ORIF ANKLE FRACTURE: SHX5408

## 2013-10-18 HISTORY — DX: Unspecified asthma, uncomplicated: J45.909

## 2013-10-18 HISTORY — DX: Anxiety disorder, unspecified: F41.9

## 2013-10-18 HISTORY — DX: Gastro-esophageal reflux disease without esophagitis: K21.9

## 2013-10-18 HISTORY — DX: Depression, unspecified: F32.A

## 2013-10-18 HISTORY — DX: Ventral hernia without obstruction or gangrene: K43.9

## 2013-10-18 LAB — CBC
HCT: 40.1 % (ref 36.0–46.0)
HEMOGLOBIN: 13.7 g/dL (ref 12.0–15.0)
MCH: 31.4 pg (ref 26.0–34.0)
MCHC: 34.2 g/dL (ref 30.0–36.0)
MCV: 91.8 fL (ref 78.0–100.0)
Platelets: 406 10*3/uL — ABNORMAL HIGH (ref 150–400)
RBC: 4.37 MIL/uL (ref 3.87–5.11)
RDW: 14 % (ref 11.5–15.5)
WBC: 14 10*3/uL — ABNORMAL HIGH (ref 4.0–10.5)

## 2013-10-18 LAB — COMPREHENSIVE METABOLIC PANEL
ALT: 20 U/L (ref 0–35)
AST: 26 U/L (ref 0–37)
Albumin: 3.3 g/dL — ABNORMAL LOW (ref 3.5–5.2)
Alkaline Phosphatase: 91 U/L (ref 39–117)
BUN: 15 mg/dL (ref 6–23)
CO2: 23 mEq/L (ref 19–32)
Calcium: 9.8 mg/dL (ref 8.4–10.5)
Chloride: 99 mEq/L (ref 96–112)
Creatinine, Ser: 0.83 mg/dL (ref 0.50–1.10)
GFR calc non Af Amer: 80 mL/min — ABNORMAL LOW (ref >90)
GLUCOSE: 111 mg/dL — AB (ref 70–99)
Potassium: 4.3 mEq/L (ref 3.7–5.3)
Sodium: 138 mEq/L (ref 137–147)
Total Bilirubin: 1 mg/dL (ref 0.3–1.2)
Total Protein: 7.7 g/dL (ref 6.0–8.3)

## 2013-10-18 LAB — PROTIME-INR
INR: 1.06 (ref 0.00–1.49)
Prothrombin Time: 13.6 seconds (ref 11.6–15.2)

## 2013-10-18 LAB — APTT: aPTT: 30 seconds (ref 24–37)

## 2013-10-18 SURGERY — OPEN REDUCTION INTERNAL FIXATION (ORIF) ANKLE FRACTURE
Anesthesia: General | Site: Ankle | Laterality: Right

## 2013-10-18 MED ORDER — EPHEDRINE SULFATE 50 MG/ML IJ SOLN
INTRAMUSCULAR | Status: AC
  Filled 2013-10-18: qty 1

## 2013-10-18 MED ORDER — ONDANSETRON HCL 4 MG/2ML IJ SOLN
INTRAMUSCULAR | Status: DC | PRN
  Administered 2013-10-18: 4 mg via INTRAVENOUS

## 2013-10-18 MED ORDER — FENTANYL CITRATE 0.05 MG/ML IJ SOLN
INTRAMUSCULAR | Status: DC | PRN
  Administered 2013-10-18 (×2): 50 ug via INTRAVENOUS

## 2013-10-18 MED ORDER — LIDOCAINE HCL (CARDIAC) 20 MG/ML IV SOLN
INTRAVENOUS | Status: AC
  Filled 2013-10-18: qty 5

## 2013-10-18 MED ORDER — OXYCODONE HCL 5 MG PO TABS
5.0000 mg | ORAL_TABLET | Freq: Once | ORAL | Status: DC | PRN
Start: 2013-10-18 — End: 2013-10-18

## 2013-10-18 MED ORDER — ACETAMINOPHEN 325 MG PO TABS
ORAL_TABLET | ORAL | Status: AC
  Filled 2013-10-18: qty 2

## 2013-10-18 MED ORDER — ASPIRIN EC 325 MG PO TBEC: 325.0000 mg | DELAYED_RELEASE_TABLET | Freq: Every day | ORAL | Status: DC

## 2013-10-18 MED ORDER — HYDROMORPHONE HCL PF 1 MG/ML IJ SOLN
0.2500 mg | INTRAMUSCULAR | Status: DC | PRN
  Administered 2013-10-18 (×2): 0.5 mg via INTRAVENOUS

## 2013-10-18 MED ORDER — ONDANSETRON HCL 4 MG/2ML IJ SOLN: 4.0000 mg | Freq: Once | INTRAMUSCULAR | Status: DC | PRN

## 2013-10-18 MED ORDER — ACETAMINOPHEN 325 MG PO TABS
325.0000 mg | ORAL_TABLET | ORAL | Status: DC | PRN
  Administered 2013-10-18: 650 mg via ORAL

## 2013-10-18 MED ORDER — MIDAZOLAM HCL 2 MG/2ML IJ SOLN
INTRAMUSCULAR | Status: AC
  Filled 2013-10-18: qty 2

## 2013-10-18 MED ORDER — CEFAZOLIN SODIUM-DEXTROSE 2-3 GM-% IV SOLR
2.0000 g | INTRAVENOUS | Status: AC
  Administered 2013-10-18: 2 g via INTRAVENOUS
  Filled 2013-10-18: qty 50

## 2013-10-18 MED ORDER — ACETAMINOPHEN 160 MG/5ML PO SOLN
325.0000 mg | ORAL | Status: DC | PRN
  Filled 2013-10-18: qty 20.3

## 2013-10-18 MED ORDER — FENTANYL CITRATE 0.05 MG/ML IJ SOLN
INTRAMUSCULAR | Status: AC
  Filled 2013-10-18: qty 5

## 2013-10-18 MED ORDER — PROPOFOL 10 MG/ML IV BOLUS
INTRAVENOUS | Status: AC
  Filled 2013-10-18: qty 40

## 2013-10-18 MED ORDER — OXYCODONE HCL 5 MG/5ML PO SOLN: 5.0000 mg | Freq: Once | ORAL | Status: DC | PRN

## 2013-10-18 MED ORDER — KETOROLAC TROMETHAMINE 30 MG/ML IJ SOLN
INTRAMUSCULAR | Status: AC
  Filled 2013-10-18: qty 1

## 2013-10-18 MED ORDER — ROCURONIUM BROMIDE 50 MG/5ML IV SOLN
INTRAVENOUS | Status: AC
  Filled 2013-10-18: qty 1

## 2013-10-18 MED ORDER — ROPIVACAINE HCL 5 MG/ML IJ SOLN
INTRAMUSCULAR | Status: DC | PRN
  Administered 2013-10-18: 30 mL via PERINEURAL

## 2013-10-18 MED ORDER — MIDAZOLAM HCL 5 MG/5ML IJ SOLN
INTRAMUSCULAR | Status: DC | PRN
  Administered 2013-10-18 (×2): 1 mg via INTRAVENOUS

## 2013-10-18 MED ORDER — HYDROMORPHONE HCL PF 1 MG/ML IJ SOLN
INTRAMUSCULAR | Status: AC
  Administered 2013-10-18: 0.5 mg via INTRAVENOUS
  Filled 2013-10-18: qty 1

## 2013-10-18 MED ORDER — KETOROLAC TROMETHAMINE 30 MG/ML IJ SOLN
15.0000 mg | Freq: Once | INTRAMUSCULAR | Status: AC | PRN
  Administered 2013-10-18: 30 mg via INTRAVENOUS

## 2013-10-18 MED ORDER — LACTATED RINGERS IV SOLN
INTRAVENOUS | Status: DC | PRN
  Administered 2013-10-18 (×2): via INTRAVENOUS

## 2013-10-18 MED ORDER — ONDANSETRON HCL 4 MG/2ML IJ SOLN
INTRAMUSCULAR | Status: AC
  Filled 2013-10-18: qty 2

## 2013-10-18 MED ORDER — STERILE WATER FOR INJECTION IJ SOLN
INTRAMUSCULAR | Status: AC
  Filled 2013-10-18: qty 10

## 2013-10-18 MED ORDER — PROPOFOL 10 MG/ML IV BOLUS
INTRAVENOUS | Status: DC | PRN
  Administered 2013-10-18: 120 mg via INTRAVENOUS

## 2013-10-18 MED ORDER — LIDOCAINE HCL (CARDIAC) 20 MG/ML IV SOLN
INTRAVENOUS | Status: DC | PRN
  Administered 2013-10-18: 80 mg via INTRAVENOUS

## 2013-10-18 MED ORDER — SUCCINYLCHOLINE CHLORIDE 20 MG/ML IJ SOLN
INTRAMUSCULAR | Status: AC
  Filled 2013-10-18: qty 1

## 2013-10-18 MED ORDER — 0.9 % SODIUM CHLORIDE (POUR BTL) OPTIME
TOPICAL | Status: DC | PRN
  Administered 2013-10-18: 1000 mL

## 2013-10-18 SURGICAL SUPPLY — 57 items
BANDAGE ESMARK 6X9 LF (GAUZE/BANDAGES/DRESSINGS) ×1 IMPLANT
BIT DRILL 2.5X110 QC LCP DISP (BIT) ×3 IMPLANT
BIT DRILL CANN 2.7X625 NONSTRL (BIT) ×3 IMPLANT
BNDG COHESIVE 4X5 TAN STRL (GAUZE/BANDAGES/DRESSINGS) ×3 IMPLANT
BNDG ESMARK 6X9 LF (GAUZE/BANDAGES/DRESSINGS) ×3
BNDG GAUZE ELAST 4 BULKY (GAUZE/BANDAGES/DRESSINGS) ×3 IMPLANT
BNDG GAUZE STRTCH 6 (GAUZE/BANDAGES/DRESSINGS) IMPLANT
COVER SURGICAL LIGHT HANDLE (MISCELLANEOUS) ×3 IMPLANT
CUFF TOURNIQUET SINGLE 34IN LL (TOURNIQUET CUFF) IMPLANT
CUFF TOURNIQUET SINGLE 44IN (TOURNIQUET CUFF) IMPLANT
DRAPE C-ARM MINI 42X72 WSTRAPS (DRAPES) IMPLANT
DRAPE INCISE IOBAN 66X45 STRL (DRAPES) ×3 IMPLANT
DRAPE OEC MINIVIEW 54X84 (DRAPES) ×3 IMPLANT
DRAPE PROXIMA HALF (DRAPES) ×3 IMPLANT
DRAPE U-SHAPE 47X51 STRL (DRAPES) ×3 IMPLANT
DRSG ADAPTIC 3X8 NADH LF (GAUZE/BANDAGES/DRESSINGS) ×3 IMPLANT
DURAPREP 26ML APPLICATOR (WOUND CARE) ×3 IMPLANT
ELECT REM PT RETURN 9FT ADLT (ELECTROSURGICAL) ×3
ELECTRODE REM PT RTRN 9FT ADLT (ELECTROSURGICAL) ×1 IMPLANT
GLOVE BIOGEL PI IND STRL 9 (GLOVE) ×1 IMPLANT
GLOVE BIOGEL PI INDICATOR 9 (GLOVE) ×2
GLOVE SURG ORTHO 9.0 STRL STRW (GLOVE) ×3 IMPLANT
GOWN STRL REUS W/ TWL XL LVL3 (GOWN DISPOSABLE) ×2 IMPLANT
GOWN STRL REUS W/TWL XL LVL3 (GOWN DISPOSABLE) ×4
GUIDEWARE NON THREAD 1.25X150 (WIRE) ×6
GUIDEWIRE NON THREAD 1.25X150 (WIRE) ×2 IMPLANT
KIT BASIN OR (CUSTOM PROCEDURE TRAY) ×3 IMPLANT
KIT ROOM TURNOVER OR (KITS) ×3 IMPLANT
MANIFOLD NEPTUNE II (INSTRUMENTS) IMPLANT
NS IRRIG 1000ML POUR BTL (IV SOLUTION) ×3 IMPLANT
PACK ORTHO EXTREMITY (CUSTOM PROCEDURE TRAY) ×3 IMPLANT
PAD ARMBOARD 7.5X6 YLW CONV (MISCELLANEOUS) ×6 IMPLANT
PADDING CAST COTTON 6X4 STRL (CAST SUPPLIES) ×3 IMPLANT
PLATE LCP 3.5 1/3 TUB 8HX93 (Plate) ×3 IMPLANT
SCREW CANN 6XFT 40X4X2.7X (Screw) ×1 IMPLANT
SCREW CANNULATED 4.0X40 (Screw) ×2 IMPLANT
SCREW CORTEX 3.5 12MM (Screw) ×6 IMPLANT
SCREW CORTEX 3.5 16MM (Screw) ×2 IMPLANT
SCREW CORTEX 3.5 18MM (Screw) ×2 IMPLANT
SCREW CORTEX 3.5 22MM (Screw) ×2 IMPLANT
SCREW LOCK CORT ST 3.5X12 (Screw) ×3 IMPLANT
SCREW LOCK CORT ST 3.5X16 (Screw) ×1 IMPLANT
SCREW LOCK CORT ST 3.5X18 (Screw) ×1 IMPLANT
SCREW LOCK CORT ST 3.5X22 (Screw) ×1 IMPLANT
SCREW LOCK T15 FT 12X3.5X2.9X (Screw) ×1 IMPLANT
SCREW LOCKING 3.5X12 (Screw) ×2 IMPLANT
SPONGE GAUZE 4X4 12PLY (GAUZE/BANDAGES/DRESSINGS) ×3 IMPLANT
SPONGE LAP 18X18 X RAY DECT (DISPOSABLE) ×3 IMPLANT
STAPLER VISISTAT 35W (STAPLE) IMPLANT
SUCTION FRAZIER TIP 10 FR DISP (SUCTIONS) ×3 IMPLANT
SUT ETHILON 2 0 PSLX (SUTURE) IMPLANT
SUT VIC AB 2-0 CTB1 (SUTURE) ×6 IMPLANT
TOWEL OR 17X24 6PK STRL BLUE (TOWEL DISPOSABLE) ×3 IMPLANT
TOWEL OR 17X26 10 PK STRL BLUE (TOWEL DISPOSABLE) ×3 IMPLANT
TUBE CONNECTING 12'X1/4 (SUCTIONS) ×1
TUBE CONNECTING 12X1/4 (SUCTIONS) ×2 IMPLANT
WATER STERILE IRR 1000ML POUR (IV SOLUTION) IMPLANT

## 2013-10-18 NOTE — Preoperative (Signed)
Beta Blockers   Reason not to administer Beta Blockers:Not Applicable, patient received Metoprolol 10/18/13.

## 2013-10-18 NOTE — Anesthesia Procedure Notes (Addendum)
Anesthesia Regional Block:  Popliteal block  Pre-Anesthetic Checklist: ,, timeout performed, Correct Patient, Correct Site, Correct Laterality, Correct Procedure, Correct Position, site marked, Risks and benefits discussed,  Surgical consent,  Pre-op evaluation,  At surgeon's request and post-op pain management  Laterality: Right and Lower  Prep: chloraprep       Needles:  Injection technique: Single-shot  Needle Type: Echogenic Stimulator Needle          Additional Needles:  Procedures: ultrasound guided (picture in chart) Popliteal block Narrative:  Start time: 10/18/2013 7:22 AM End time: 10/18/2013 7:29 AM Injection made incrementally with aspirations every 5 mL.  Performed by: Personally  Anesthesiologist: Ermalene Postin   Procedure Name: LMA Insertion Date/Time: 10/18/2013 7:59 AM Performed by: Jacob Moores Pre-anesthesia Checklist: Patient identified, Emergency Drugs available, Suction available and Patient being monitored Patient Re-evaluated:Patient Re-evaluated prior to inductionOxygen Delivery Method: Circle system utilized Preoxygenation: Pre-oxygenation with 100% oxygen Intubation Type: IV induction Ventilation: Mask ventilation without difficulty LMA: LMA inserted LMA Size: 4.0 Number of attempts: 1 Placement Confirmation: positive ETCO2 and breath sounds checked- equal and bilateral Tube secured with: Tape Dental Injury: Teeth and Oropharynx as per pre-operative assessment

## 2013-10-18 NOTE — Transfer of Care (Signed)
Immediate Anesthesia Transfer of Care Note  Patient: Natalie Cobb  Procedure(s) Performed: Procedure(s): OPEN REDUCTION INTERNAL FIXATION (ORIF) RIGHT TRIMALLEOLAR ANKLE FRACTURE (Right)  Patient Location: PACU  Anesthesia Type:General and Regional  Level of Consciousness: awake and alert   Airway & Oxygen Therapy: Patient Spontanous Breathing and Patient connected to nasal cannula oxygen  Post-op Assessment: Report given to PACU RN and Post -op Vital signs reviewed and stable  Post vital signs: Reviewed and stable  Complications: No apparent anesthesia complications

## 2013-10-18 NOTE — H&P (Signed)
Natalie Cobb is an 53 y.o. female.   Chief Complaint: Right ankle fracture dislocation HPI: Patient is a 53 year old woman who slipped and fell sustaining a trimalleolar ankle fracture dislocation on the right.  Past Medical History  Diagnosis Date  . Hypertension   . Diverticulitis   . GERD (gastroesophageal reflux disease)   . Complication of anesthesia     did not get to sleep brfore Colonscopy  . Anxiety     anxiety attacks  . Asthma   . Depression   . IBS (irritable bowel syndrome)   . Hernia of abdominal wall   . Cancer     Wilms tumor 74month    Past Surgical History  Procedure Laterality Date  . Nephectomy    . Spleenectomy    . Tubal ligation    . Wisdom tooth extraction    . Revision of scar      of abdominal scar age 53 . Lymph gland excision      neck in 20's    History reviewed. No pertinent family history. Social History:  reports that she has quit smoking. She does not have any smokeless tobacco history on file. She reports that she drinks alcohol. She reports that she uses illicit drugs (Marijuana).  Allergies:  Allergies  Allergen Reactions  . Compazine [Prochlorperazine Edisylate] Other (See Comments)    Patient has "muscular" reaction to all medications that end in "ZINE"  . Meclizine     Patient has "muscular" reaction to all medications that end in "ZINE"  . Thorazine [Chlorpromazine] Other (See Comments)    Patient has "muscular" reaction to all medications that end in "ZINE"  . Sulfa Antibiotics Rash    Medications Prior to Admission  Medication Sig Dispense Refill  . albuterol (PROAIR HFA) 108 (90 BASE) MCG/ACT inhaler Inhale 2 puffs into the lungs every 6 (six) hours as needed for wheezing or shortness of breath.      .Marland Kitchenamitriptyline (ELAVIL) 10 MG tablet Take 10 mg by mouth 2 (two) times daily.      .Marland Kitchenaspirin EC 81 MG tablet Take 81 mg by mouth daily.      .Marland KitchenbuPROPion (WELLBUTRIN XL) 150 MG 24 hr tablet Take 150 mg by mouth every  evening.      . Cholecalciferol (VITAMIN D-3) 5000 UNITS TABS Take by mouth every evening.      . clonazePAM (KLONOPIN) 1 MG tablet Take 1 mg by mouth 2 (two) times daily.      .Marland Kitchendicyclomine (BENTYL) 20 MG tablet Take 20 mg by mouth daily as needed (for stomach pain).      . fish oil-omega-3 fatty acids 1000 MG capsule Take 2 g by mouth every evening.      .Marland Kitchenibuprofen (ADVIL,MOTRIN) 200 MG tablet Take 400 mg by mouth every 6 (six) hours as needed for pain.      . metoprolol tartrate (LOPRESSOR) 25 MG tablet Take 25 mg by mouth 2 (two) times daily.      .Marland KitchenoxyCODONE-acetaminophen (PERCOCET/ROXICET) 5-325 MG per tablet Take 2 tablets by mouth every 4 (four) hours as needed for severe pain.  15 tablet  0  . pantoprazole (PROTONIX) 40 MG tablet Take 40 mg by mouth 2 (two) times daily.      . simvastatin (ZOCOR) 40 MG tablet Take 40 mg by mouth every evening.        Results for orders placed during the hospital encounter of 10/18/13 (from the past 48 hour(s))  APTT     Status: None   Collection Time    10/18/13  6:28 AM      Result Value Ref Range   aPTT 30  24 - 37 seconds  CBC     Status: Abnormal   Collection Time    10/18/13  6:28 AM      Result Value Ref Range   WBC 14.0 (*) 4.0 - 10.5 K/uL   RBC 4.37  3.87 - 5.11 MIL/uL   Hemoglobin 13.7  12.0 - 15.0 g/dL   HCT 40.1  36.0 - 46.0 %   MCV 91.8  78.0 - 100.0 fL   MCH 31.4  26.0 - 34.0 pg   MCHC 34.2  30.0 - 36.0 g/dL   RDW 14.0  11.5 - 15.5 %   Platelets 406 (*) 150 - 400 K/uL  COMPREHENSIVE METABOLIC PANEL     Status: Abnormal   Collection Time    10/18/13  6:28 AM      Result Value Ref Range   Sodium 138  137 - 147 mEq/L   Potassium 4.3  3.7 - 5.3 mEq/L   Chloride 99  96 - 112 mEq/L   CO2 23  19 - 32 mEq/L   Glucose, Bld 111 (*) 70 - 99 mg/dL   BUN 15  6 - 23 mg/dL   Creatinine, Ser 0.83  0.50 - 1.10 mg/dL   Calcium 9.8  8.4 - 10.5 mg/dL   Total Protein 7.7  6.0 - 8.3 g/dL   Albumin 3.3 (*) 3.5 - 5.2 g/dL   AST 26  0 - 37  U/L   ALT 20  0 - 35 U/L   Alkaline Phosphatase 91  39 - 117 U/L   Total Bilirubin 1.0  0.3 - 1.2 mg/dL   GFR calc non Af Amer 80 (*) >90 mL/min   GFR calc Af Amer >90  >90 mL/min   Comment: (NOTE)     The eGFR has been calculated using the CKD EPI equation.     This calculation has not been validated in all clinical situations.     eGFR's persistently <90 mL/min signify possible Chronic Kidney     Disease.  PROTIME-INR     Status: None   Collection Time    10/18/13  6:28 AM      Result Value Ref Range   Prothrombin Time 13.6  11.6 - 15.2 seconds   INR 1.06  0.00 - 1.49   Dg Chest 2 View  10/18/2013   CLINICAL DATA:  "SSA".  Ankle repair.  Hypertension.  EXAM: CHEST  2 VIEW  COMPARISON:  None.  FINDINGS: Normal heart size and mediastinal contours. No acute infiltrate or edema. No effusion or pneumothorax. No acute osseous findings.  IMPRESSION: No active cardiopulmonary disease.   Electronically Signed   By: Jorje Guild M.D.   On: 10/18/2013 07:28    Review of Systems  All other systems reviewed and are negative.    Blood pressure 115/68, pulse 71, temperature 98.2 F (36.8 C), temperature source Oral, resp. rate 16, SpO2 95.00%. Physical Exam  On examination patient has a strong dorsalis pedis pulse there is no ecchymosis and bruising around the ankle there is no fracture blisters no skin breakdown noted. Radiograph shows a trimalleolar fracture dislocation of the right ankle. Assessment/Plan Assessment: Right ankle trimalleolar fracture dislocation.  Plan: We'll plan for open reduction internal fixation. Risks and benefits were discussed including infection neurovascular injury DVT arthritis need for additional  surgery. Patient states she understands and wished to proceed at this time.  Savera Donson V 10/18/2013, 7:34 AM

## 2013-10-18 NOTE — Progress Notes (Signed)
Orthopedic Tech Progress Note Patient Details:  Natalie Cobb 11/11/1960 [id number] CAM Walker applied to Right LE. Application tolerated well.  Ortho Devices Type of Ortho Device: CAM walker Ortho Device/Splint Location: Right LE Ortho Device/Splint Interventions: Application   Asia R Thompson 10/18/2013, 9:38 AM

## 2013-10-18 NOTE — Anesthesia Postprocedure Evaluation (Signed)
  Anesthesia Post-op Note  Patient: Natalie Cobb  Procedure(s) Performed: Procedure(s): OPEN REDUCTION INTERNAL FIXATION (ORIF) RIGHT TRIMALLEOLAR ANKLE FRACTURE (Right)  Patient Location: PACU  Anesthesia Type:General and Regional  Level of Consciousness: awake, alert  and oriented  Airway and Oxygen Therapy: Patient Spontanous Breathing  Post-op Pain: mild  Post-op Assessment: Post-op Vital signs reviewed, Patient's Cardiovascular Status Stable, Respiratory Function Stable, Patent Airway, No signs of Nausea or vomiting and Pain level controlled  Post-op Vital Signs: Reviewed and stable  Complications: No apparent anesthesia complications

## 2013-10-18 NOTE — Discharge Instructions (Signed)
Touchdown weightbearing right foot with fracture boot in place. Ice and elevation right ankle. Keep dressing clean dry and intact until followup.

## 2013-10-18 NOTE — Anesthesia Preprocedure Evaluation (Addendum)
Anesthesia Evaluation  Patient identified by MRN, date of birth, ID band Patient awake    Reviewed: Allergy & Precautions, H&P , NPO status , Patient's Chart, lab work & pertinent test results, reviewed documented beta blocker date and time   Airway Mallampati: I TM Distance: >3 FB Neck ROM: Full    Dental  (+) Dental Advisory Given, Teeth Intact   Pulmonary asthma , former smoker,          Cardiovascular hypertension, Pt. on home beta blockers     Neuro/Psych PSYCHIATRIC DISORDERS Anxiety Depression    GI/Hepatic GERD-  Medicated,  Endo/Other    Renal/GU      Musculoskeletal   Abdominal   Peds  Hematology   Anesthesia Other Findings   Reproductive/Obstetrics                          Anesthesia Physical Anesthesia Plan  ASA: II  Anesthesia Plan: General   Post-op Pain Management:    Induction: Intravenous  Airway Management Planned: Oral ETT and LMA  Additional Equipment:   Intra-op Plan:   Post-operative Plan: Extubation in OR  Informed Consent: I have reviewed the patients History and Physical, chart, labs and discussed the procedure including the risks, benefits and alternatives for the proposed anesthesia with the patient or authorized representative who has indicated his/her understanding and acceptance.   Dental advisory given  Plan Discussed with: CRNA, Anesthesiologist and Surgeon  Anesthesia Plan Comments:         Anesthesia Quick Evaluation

## 2013-10-18 NOTE — Op Note (Signed)
OPERATIVE REPORT  DATE OF SURGERY: 10/18/2013  PATIENT:  Natalie Cobb,  53 y.o. female  PRE-OPERATIVE DIAGNOSIS:  Right ankle trimalleolar fracture/dislocation  POST-OPERATIVE DIAGNOSIS:  Right ankle trimalleolar fracture/dislocation  PROCEDURE:  Procedure(s): OPEN REDUCTION INTERNAL FIXATION (ORIF) RIGHT TRIMALLEOLAR ANKLE FRACTURE  SURGEON:  Surgeon(s): Newt Minion, MD  ANESTHESIA:   regional and general  EBL:  min ML  SPECIMEN:  No Specimen  TOURNIQUET:  * No tourniquets in log *  PROCEDURE DETAILS: Patient is a 53 year old woman who had a mechanical fall right ankle she underwent closed reduction emergency room after the fracture dislocation was seen in the office and was brought to the operating room for urgent open reduction internal fixation. Risks and benefits were discussed including infection neurovascular injury pain DVT pulmonary embolus need for additional surgery arthritis. Patient states she understands and wished to proceed at this time. Description of procedure patient was brought to the operating room and underwent a general anesthetic after a popliteal block. After adequate levels and anesthesia obtained patient's right lower extremity was prepped using DuraPrep and draped into a sterile field. A lateral incision was made over the fibula was carried sharply down to the fracture site. The fracture was irrigated there were 2 large fracture fragments these were reduced and stabilized with a lag screw. A neutralizing lateral plate was applied 8 hole and locked distally and compression screws proximally to stabilize the fracture. C-arm fluoroscopy verified restoration of the length and restoration alignment of the fibula. The joint was reduced. The wound was irrigated with normal saline subcutaneous is closed in 2-0 Vicryl the skin was closed using staples. A incision was made medially this was carried down to the fracture site. The fracture was freshened there was some  articular cartilage fragments within the joint these were removed. Again the joint was irrigated with normal saline. The fracture was reduced stabilized with a K wire C-arm fluoroscopy verified alignment and a cannulated screw 40 mm in length was used to stabilize the medial malleolar fracture. There was not enough room to place an additional screw. C-arm fluoroscopy verified reduction in both AP and lateral planes. The wounds irrigated normal saline. Subcutaneous is closed in 2-0 Vicryl skin was closed using staples. The wounds were covered with Adaptic orthopedic sponges Kerlix web rolling Coban. Patient was extubated taken to the PACU in stable condition plan for discharge to home.  PLAN OF CARE: Discharge to home after PACU  PATIENT DISPOSITION:  PACU - hemodynamically stable.   Newt Minion, MD 10/18/2013 9:11 AM

## 2013-10-24 ENCOUNTER — Encounter (HOSPITAL_COMMUNITY): Payer: Self-pay | Admitting: Orthopedic Surgery

## 2013-10-29 ENCOUNTER — Encounter (HOSPITAL_COMMUNITY): Payer: Self-pay | Admitting: Orthopedic Surgery

## 2013-11-05 ENCOUNTER — Encounter (HOSPITAL_COMMUNITY): Payer: Self-pay | Admitting: Emergency Medicine

## 2013-11-05 ENCOUNTER — Inpatient Hospital Stay (HOSPITAL_COMMUNITY)
Admission: EM | Admit: 2013-11-05 | Discharge: 2013-11-06 | DRG: 310 | Disposition: A | Discharge status: Home / Self Care | Payer: BC Managed Care – PPO | Attending: Internal Medicine | Admitting: Internal Medicine

## 2013-11-05 ENCOUNTER — Emergency Department (HOSPITAL_COMMUNITY): Payer: BC Managed Care – PPO

## 2013-11-05 DIAGNOSIS — F419 Anxiety disorder, unspecified: Secondary | ICD-10-CM | POA: Diagnosis present

## 2013-11-05 DIAGNOSIS — Z8781 Personal history of (healed) traumatic fracture: Secondary | ICD-10-CM

## 2013-11-05 DIAGNOSIS — I10 Essential (primary) hypertension: Secondary | ICD-10-CM | POA: Diagnosis present

## 2013-11-05 DIAGNOSIS — F410 Panic disorder [episodic paroxysmal anxiety] without agoraphobia: Secondary | ICD-10-CM | POA: Diagnosis present

## 2013-11-05 DIAGNOSIS — F411 Generalized anxiety disorder: Secondary | ICD-10-CM

## 2013-11-05 DIAGNOSIS — Z87891 Personal history of nicotine dependence: Secondary | ICD-10-CM

## 2013-11-05 DIAGNOSIS — K219 Gastro-esophageal reflux disease without esophagitis: Secondary | ICD-10-CM | POA: Diagnosis present

## 2013-11-05 DIAGNOSIS — F3289 Other specified depressive episodes: Secondary | ICD-10-CM | POA: Diagnosis present

## 2013-11-05 DIAGNOSIS — K589 Irritable bowel syndrome without diarrhea: Secondary | ICD-10-CM | POA: Diagnosis present

## 2013-11-05 DIAGNOSIS — J45909 Unspecified asthma, uncomplicated: Secondary | ICD-10-CM | POA: Diagnosis present

## 2013-11-05 DIAGNOSIS — I480 Paroxysmal atrial fibrillation: Secondary | ICD-10-CM | POA: Diagnosis present

## 2013-11-05 DIAGNOSIS — Z85528 Personal history of other malignant neoplasm of kidney: Secondary | ICD-10-CM

## 2013-11-05 DIAGNOSIS — I4891 Unspecified atrial fibrillation: Principal | ICD-10-CM | POA: Diagnosis present

## 2013-11-05 DIAGNOSIS — F329 Major depressive disorder, single episode, unspecified: Secondary | ICD-10-CM | POA: Diagnosis present

## 2013-11-05 DIAGNOSIS — Z888 Allergy status to other drugs, medicaments and biological substances status: Secondary | ICD-10-CM

## 2013-11-05 LAB — COMPREHENSIVE METABOLIC PANEL
ALBUMIN: 3.4 g/dL — AB (ref 3.5–5.2)
ALT: 12 U/L (ref 0–35)
AST: 21 U/L (ref 0–37)
Alkaline Phosphatase: 96 U/L (ref 39–117)
BILIRUBIN TOTAL: 1.4 mg/dL — AB (ref 0.3–1.2)
BUN: 12 mg/dL (ref 6–23)
CO2: 22 meq/L (ref 19–32)
CREATININE: 0.9 mg/dL (ref 0.50–1.10)
Calcium: 9.9 mg/dL (ref 8.4–10.5)
Chloride: 100 mEq/L (ref 96–112)
GFR, EST AFRICAN AMERICAN: 84 mL/min — AB (ref >90)
GFR, EST NON AFRICAN AMERICAN: 72 mL/min — AB (ref >90)
Glucose, Bld: 116 mg/dL — ABNORMAL HIGH (ref 70–99)
Potassium: 4.5 mEq/L (ref 3.7–5.3)
Sodium: 140 mEq/L (ref 137–147)
Total Protein: 8.4 g/dL — ABNORMAL HIGH (ref 6.0–8.3)

## 2013-11-05 LAB — CBC
HCT: 39.9 % (ref 36.0–46.0)
Hemoglobin: 13.9 g/dL (ref 12.0–15.0)
MCH: 31.3 pg (ref 26.0–34.0)
MCHC: 34.8 g/dL (ref 30.0–36.0)
MCV: 89.9 fL (ref 78.0–100.0)
Platelets: 568 10*3/uL — ABNORMAL HIGH (ref 150–400)
RBC: 4.44 MIL/uL (ref 3.87–5.11)
RDW: 14 % (ref 11.5–15.5)
WBC: 18 10*3/uL — ABNORMAL HIGH (ref 4.0–10.5)

## 2013-11-05 LAB — T4: T4, Total: 10.4 ug/dL (ref 5.0–12.5)

## 2013-11-05 LAB — I-STAT TROPONIN, ED: Troponin i, poc: 0.01 ng/mL (ref 0.00–0.08)

## 2013-11-05 LAB — TROPONIN I: Troponin I: <0.3 ng/mL (ref <0.30)

## 2013-11-05 LAB — TSH: TSH: 2.19 u[IU]/mL (ref 0.350–4.500)

## 2013-11-05 LAB — POC URINE PREG, ED: Preg Test, Ur: NEGATIVE

## 2013-11-05 LAB — MRSA PCR SCREENING: MRSA by PCR: NEGATIVE

## 2013-11-05 LAB — MAGNESIUM: Magnesium: 1.8 mg/dL (ref 1.5–2.5)

## 2013-11-05 MED ORDER — SIMVASTATIN 40 MG PO TABS
40.0000 mg | ORAL_TABLET | Freq: Every evening | ORAL | Status: DC
Start: 2013-11-05 — End: 2013-11-06
  Administered 2013-11-05: 40 mg via ORAL
  Filled 2013-11-05 (×2): qty 1

## 2013-11-05 MED ORDER — ACETAMINOPHEN 325 MG PO TABS: 650.0000 mg | ORAL_TABLET | Freq: Four times a day (QID) | ORAL | Status: DC | PRN

## 2013-11-05 MED ORDER — IOHEXOL 350 MG/ML SOLN
100.0000 mL | Freq: Once | INTRAVENOUS | Status: AC | PRN
  Administered 2013-11-05: 100 mL via INTRAVENOUS

## 2013-11-05 MED ORDER — CLONAZEPAM 1 MG PO TABS
1.0000 mg | ORAL_TABLET | Freq: Two times a day (BID) | ORAL | Status: DC
  Administered 2013-11-05 – 2013-11-06 (×2): 1 mg via ORAL
  Filled 2013-11-05 (×2): qty 1

## 2013-11-05 MED ORDER — SODIUM CHLORIDE 0.9 % IJ SOLN
3.0000 mL | Freq: Two times a day (BID) | INTRAMUSCULAR | Status: DC
  Administered 2013-11-05 – 2013-11-06 (×2): 3 mL via INTRAVENOUS

## 2013-11-05 MED ORDER — ONDANSETRON HCL 4 MG/2ML IJ SOLN
4.0000 mg | Freq: Once | INTRAMUSCULAR | Status: AC
  Administered 2013-11-05: 4 mg via INTRAVENOUS
  Filled 2013-11-05: qty 2

## 2013-11-05 MED ORDER — HEPARIN SODIUM (PORCINE) 5000 UNIT/ML IJ SOLN
5000.0000 [IU] | Freq: Three times a day (TID) | INTRAMUSCULAR | Status: DC
  Administered 2013-11-05 – 2013-11-06 (×3): 5000 [IU] via SUBCUTANEOUS
  Filled 2013-11-05 (×5): qty 1

## 2013-11-05 MED ORDER — ZOLPIDEM TARTRATE 5 MG PO TABS: 5.0000 mg | ORAL_TABLET | Freq: Every evening | ORAL | Status: DC | PRN

## 2013-11-05 MED ORDER — ASPIRIN EC 81 MG PO TBEC
81.0000 mg | DELAYED_RELEASE_TABLET | Freq: Every day | ORAL | Status: DC
  Administered 2013-11-05 – 2013-11-06 (×2): 81 mg via ORAL
  Filled 2013-11-05 (×2): qty 1

## 2013-11-05 MED ORDER — DILTIAZEM HCL 100 MG IV SOLR
5.0000 mg/h | INTRAVENOUS | Status: DC
  Administered 2013-11-05: 10 mg/h via INTRAVENOUS

## 2013-11-05 MED ORDER — ACETAMINOPHEN 650 MG RE SUPP: 650.0000 mg | Freq: Four times a day (QID) | RECTAL | Status: DC | PRN

## 2013-11-05 MED ORDER — ALBUTEROL SULFATE (2.5 MG/3ML) 0.083% IN NEBU: 2.5000 mg | INHALATION_SOLUTION | Freq: Four times a day (QID) | RESPIRATORY_TRACT | Status: DC | PRN

## 2013-11-05 MED ORDER — DILTIAZEM HCL 100 MG IV SOLR
5.0000 mg/h | Freq: Once | INTRAVENOUS | Status: AC
  Administered 2013-11-05: 5 mg/h via INTRAVENOUS

## 2013-11-05 MED ORDER — METOPROLOL TARTRATE 50 MG PO TABS
50.0000 mg | ORAL_TABLET | Freq: Two times a day (BID) | ORAL | Status: DC
  Administered 2013-11-05 – 2013-11-06 (×2): 50 mg via ORAL
  Filled 2013-11-05 (×3): qty 1

## 2013-11-05 MED ORDER — PANTOPRAZOLE SODIUM 40 MG PO TBEC
40.0000 mg | DELAYED_RELEASE_TABLET | Freq: Two times a day (BID) | ORAL | Status: DC
  Administered 2013-11-05 – 2013-11-06 (×2): 40 mg via ORAL
  Filled 2013-11-05 (×2): qty 1

## 2013-11-05 MED ORDER — ALBUTEROL SULFATE HFA 108 (90 BASE) MCG/ACT IN AERS: 2.0000 | INHALATION_SPRAY | Freq: Four times a day (QID) | RESPIRATORY_TRACT | Status: DC | PRN

## 2013-11-05 MED ORDER — MORPHINE SULFATE 2 MG/ML IJ SOLN: 2.0000 mg | INTRAMUSCULAR | Status: DC | PRN

## 2013-11-05 MED ORDER — DILTIAZEM HCL 25 MG/5ML IV SOLN
20.0000 mg | Freq: Once | INTRAVENOUS | Status: AC
  Administered 2013-11-05: 20 mg via INTRAVENOUS

## 2013-11-05 MED ORDER — OXYCODONE-ACETAMINOPHEN 5-325 MG PO TABS
1.0000 | ORAL_TABLET | ORAL | Status: DC | PRN
  Administered 2013-11-05 – 2013-11-06 (×3): 1 via ORAL
  Filled 2013-11-05 (×3): qty 1

## 2013-11-05 NOTE — ED Provider Notes (Signed)
CSN: [id number]     Arrival date & time 11/05/13  1400 History   First MD Initiated Contact with Patient 11/05/13 1413     Chief Complaint  Patient presents with  . Chest Pain     (Consider location/radiation/quality/duration/timing/severity/associated sxs/prior Treatment) HPI Patient reports she was having some palpitations several years ago and was seen at [facility] cardiology and had a normal Holter exam. She states about a year and a half ago she saw a cardiologist while she was living in [place] who recommended she get a stress test which she did not followup with. She reports she had ankle fracture dislocation with surgery done on March 11. She has not been as active as usual. She states she had trouble sleeping last night. She took Klonopin about 9:30 PM and then at 4 AM she took her Percocet and two Benadryl because she couldn't sleep. She reports about 9:30 this morning she started feeling short of breath, diaphoretic, and had chest pain that she describes as tightness and a difficulty breathing. She had nausea all day yesterday and today. She denies any vomiting. She states she feels dizzy and lightheaded today. She states she's aware that her heart is beating fast. She reports yesterday she was aching all over and was not able to keep because of the nausea. She states she has not felt this way before. Patient has not been able to take her metoprolol today that she takes for blood pressure. Patient went to [facility] urgent care today. She was given aspirin at that facility. EMS gave her 20 of Cardizem and then 15 minutes later 5 mg. She had refused adenosine the  Family history she states her maternal grandmother died at age 36 from an MI.  PCP Dr Waunita Schooner of [facility] at [facility] [place]  Past Medical History  Diagnosis Date  . Hypertension   . Diverticulitis   . GERD (gastroesophageal reflux disease)   . Complication of anesthesia     did not get to sleep brfore Colonscopy  . Anxiety      anxiety attacks  . Asthma   . Depression   . IBS (irritable bowel syndrome)   . Hernia of abdominal wall   . Cancer     Wilms tumor 42months   Past Surgical History  Procedure Laterality Date  . Nephectomy    . Spleenectomy    . Tubal ligation    . Wisdom tooth extraction    . Revision of scar      of abdominal scar age 8  . Lymph gland excision      neck in 20's  . Orif ankle fracture Right 10/18/2013    Procedure: OPEN REDUCTION INTERNAL FIXATION (ORIF) RIGHT TRIMALLEOLAR ANKLE FRACTURE;  Surgeon: Newt Minion, MD;  Location: [facility];  Service: Orthopedics;  Laterality: Right;   No family history on file. History  Substance Use Topics  . Smoking status: Former Smoker -- 10 years  . Smokeless tobacco: Not on file  . Alcohol Use: Yes     Comment: rarely   unemployed  OB History   Grav Para Term Preterm Abortions TAB SAB Ect Mult Living                 Review of Systems  All other systems reviewed and are negative.      Allergies  Compazine; Meclizine; Thorazine; and Sulfa antibiotics  Home Medications   Current Outpatient Rx  Name  Route  Sig  Dispense  Refill  .  albuterol (PROAIR HFA) 108 (90 BASE) MCG/ACT inhaler   Inhalation   Inhale 2 puffs into the lungs every 6 (six) hours as needed for wheezing or shortness of breath.         Marland Kitchen amitriptyline (ELAVIL) 10 MG tablet   Oral   Take 5 mg by mouth daily.          Marland Kitchen aspirin EC 325 MG tablet   Oral   Take 325 mg by mouth daily.         Marland Kitchen buPROPion (WELLBUTRIN XL) 150 MG 24 hr tablet   Oral   Take 150 mg by mouth every evening.         . Cholecalciferol (VITAMIN D-3) 5000 UNITS TABS   Oral   Take 5,000 Units by mouth every evening.          . clonazePAM (KLONOPIN) 1 MG tablet   Oral   Take 1 mg by mouth 2 (two) times daily.         Marland Kitchen dicyclomine (BENTYL) 20 MG tablet   Oral   Take 20 mg by mouth daily as needed (for stomach pain).         . fish oil-omega-3 fatty acids 1000 MG  capsule   Oral   Take 1 g by mouth every evening.          . metoprolol tartrate (LOPRESSOR) 25 MG tablet   Oral   Take 25 mg by mouth 2 (two) times daily.         Marland Kitchen oxyCODONE-acetaminophen (PERCOCET/ROXICET) 5-325 MG per tablet   Oral   Take 1 tablet by mouth every 4 (four) hours as needed for severe pain.         . pantoprazole (PROTONIX) 40 MG tablet   Oral   Take 40 mg by mouth 2 (two) times daily.         . simvastatin (ZOCOR) 40 MG tablet   Oral   Take 40 mg by mouth every evening.          ED Triage Vitals  Enc Vitals Group     BP 11/05/13 1405 129/61 mmHg     Pulse Rate 11/05/13 1410 89     Resp 11/05/13 1405 25     Temp 11/05/13 1405 98.2 F (36.8 C)     Temp src 11/05/13 1405 Oral     SpO2 11/05/13 1405 96 %     Weight --      Height --      Head Cir --      Peak Flow --      Pain Score 11/05/13 1535 4     Pain Loc --      Pain Edu? --      Excl. in [place]? --    Vital signs normal except on the monitor patient has a irregular heart rate that varies from 110-210.   Physical Exam  Nursing note and vitals reviewed. Constitutional: She is oriented to person, place, and time. She appears well-developed and well-nourished.  Non-toxic appearance. She does not appear ill. No distress.  HENT:  Head: Normocephalic and atraumatic.  Right Ear: External ear normal.  Left Ear: External ear normal.  Nose: Nose normal. No mucosal edema or rhinorrhea.  Mouth/Throat: Oropharynx is clear and moist and mucous membranes are normal. No dental abscesses or uvula swelling.  Eyes: Conjunctivae and EOM are normal. Pupils are equal, round, and reactive to light.  Neck: Normal range of motion and  full passive range of motion without pain. Neck supple.  Cardiovascular: Normal heart sounds.  An irregularly irregular rhythm present. Tachycardia present.  Exam reveals no gallop and no friction rub.   No murmur heard. Pulmonary/Chest: Effort normal and breath sounds normal. No  respiratory distress. She has no wheezes. She has no rhonchi. She has no rales. She exhibits no tenderness and no crepitus.  Abdominal: Soft. Normal appearance and bowel sounds are normal. She exhibits no distension. There is no tenderness. There is no rebound and no guarding.  Musculoskeletal: Normal range of motion. She exhibits no edema and no tenderness.  Moves all extremities well.  Pt has cam walker on her RLE  Neurological: She is alert and oriented to person, place, and time. She has normal strength. No cranial nerve deficit.  Skin: Skin is warm, dry and intact. No rash noted. No erythema. No pallor.  Psychiatric: She has a normal mood and affect. Her speech is normal and behavior is normal. Her mood appears not anxious.    ED Course  Procedures (including critical care time)  Medications  diltiazem (CARDIZEM) 100 mg in dextrose 5 % 100 mL infusion (15 mg/hr Intravenous Rate/Dose Change 11/05/13 1751)  diltiazem (CARDIZEM) injection 20 mg (0 mg Intravenous Stopped 11/05/13 1433)  iohexol (OMNIPAQUE) 350 MG/ML injection 100 mL (100 mLs Intravenous Contrast Given 11/05/13 1726)  ondansetron (ZOFRAN) injection 4 mg (4 mg Intravenous Given 11/05/13 1803)    14:45 pt feeling better HR on cardizem drip now 110-150. We discussed she is at risk for PE/DVT with her recent surgery and no prior hx of arrhythmia. She is agreeable to CT angio chest.   Discussed results of her CT angio. Pt still in afib with rate around 110. Pt is feeling better. Discussed need for admission and she is agreeable.  18:10 Dr Wyline Copas admit to step down team 10  Labs Review Results for orders placed during the hospital encounter of 11/05/13  CBC      Result Value Ref Range   WBC 18.0 (*) 4.0 - 10.5 K/uL   RBC 4.44  3.87 - 5.11 MIL/uL   Hemoglobin 13.9  12.0 - 15.0 g/dL   HCT 39.9  36.0 - 46.0 %   MCV 89.9  78.0 - 100.0 fL   MCH 31.3  26.0 - 34.0 pg   MCHC 34.8  30.0 - 36.0 g/dL   RDW 14.0  11.5 - 15.5 %   Platelets  568 (*) 150 - 400 K/uL  COMPREHENSIVE METABOLIC PANEL      Result Value Ref Range   Sodium 140  137 - 147 mEq/L   Potassium 4.5  3.7 - 5.3 mEq/L   Chloride 100  96 - 112 mEq/L   CO2 22  19 - 32 mEq/L   Glucose, Bld 116 (*) 70 - 99 mg/dL   BUN 12  6 - 23 mg/dL   Creatinine, Ser 0.90  0.50 - 1.10 mg/dL   Calcium 9.9  8.4 - 10.5 mg/dL   Total Protein 8.4 (*) 6.0 - 8.3 g/dL   Albumin 3.4 (*) 3.5 - 5.2 g/dL   AST 21  0 - 37 U/L   ALT 12  0 - 35 U/L   Alkaline Phosphatase 96  39 - 117 U/L   Total Bilirubin 1.4 (*) 0.3 - 1.2 mg/dL   GFR calc non Af Amer 72 (*) >90 mL/min   GFR calc Af Amer 84 (*) >90 mL/min  MAGNESIUM      Result  Value Ref Range   Magnesium 1.8  1.5 - 2.5 mg/dL  TSH      Result Value Ref Range   TSH 2.190  0.350 - 4.500 uIU/mL  I-STAT TROPOININ, ED      Result Value Ref Range   Troponin i, poc 0.01  0.00 - 0.08 ng/mL   Comment 3           POC URINE PREG, ED      Result Value Ref Range   Preg Test, Ur NEGATIVE  NEGATIVE   Laboratory interpretation all normal except     Imaging Review Ct Angio Chest W/cm &/or Wo Cm  11/05/2013   CLINICAL DATA:  Chest pain, recent left ankle fracture with surgery, new onset AFib, evaluate for PE  EXAM: CT ANGIOGRAPHY CHEST WITH CONTRAST  TECHNIQUE: Multidetector CT imaging of the chest was performed using the standard protocol during bolus administration of intravenous contrast. Multiplanar CT image reconstructions and MIPs were obtained to evaluate the vascular anatomy.  CONTRAST:  11mL OMNIPAQUE IOHEXOL 350 MG/ML SOLN  COMPARISON:  Chest radiograph dated 11/05/2012  FINDINGS: No evidence of pulmonary embolism.  Lungs are essentially clear. Mild subpleural reticulation/ scarring in the right middle lobe and lingula. Mild mosaic attenuation. No pleural effusion or pneumothorax.  Visualized thyroid is unremarkable.  The heart is normal size.  No pericardial effusion.  No suspicious mediastinal, hilar, or axillary lymphadenopathy.   Visualized upper abdomen node grossly unremarkable.  Visualized osseous structures are within normal limits.  Review of the MIP images confirms the above findings.  IMPRESSION: No evidence of pulmonary embolism.  No evidence of acute cardiopulmonary disease.   Electronically Signed   By: Julian Hy M.D.   On: 11/05/2013 17:52   Dg Chest Port 1 View  11/05/2013   CLINICAL DATA:  Chest pain  EXAM: PORTABLE CHEST - 1 VIEW  COMPARISON:  October 18, 2013  FINDINGS: The lungs are borderline hyperexpanded but clear. Heart size and pulmonary vascularity are normal. No adenopathy. No pneumothorax. No bone lesions.  IMPRESSION: No edema or consolidation.   Electronically Signed   By: Lowella Grip M.D.   On: 11/05/2013 14:25        Dg Knee 1-2 Views Right  10/15/2013   CLINICAL DATA Right ankle deformity, fall    IMPRESSION No acute osseous abnormalities.  SIGNATURE  Electronically Signed   By: Lavonia Dana M.D.   On: 10/15/2013 15:32   Dg Ankle 2 Views Right  10/15/2013   CLINICAL DATA Postreduction   IMPRESSION Relocated tibiotalar joint. Improved distal fibula and tibia fracture alignment.  SIGNATURE  Electronically Signed   By: Jorje Guild M.D.   On: 10/15/2013 18:09   Dg Ankle 2 Views Right  10/15/2013   CLINICAL DATA Right ankle deformity post fall today  IMPRESSION Bimalleolar versus trimalleolar fracture-dislocation right ankle as above.  SIGNATURE  Electronically Signed   By: Lavonia Dana M.D.   On: 10/15/2013 15:34   Ct Head Wo Contrast  Ct Cervical Spine Wo Contrast  10/20/2013   CLINICAL DATA:  53 year old who fell down approximately 5 stairs.    IMPRESSION: CT Head:  1. Normal examination.  CT Cervical Spine:  1. No cervical spine fractures identified. 2. Severe degenerative disc disease and spondylosis at C5-6 with moderate bilateral foraminal stenoses at this level, progressive since 2009.   Electronically Signed   By: Evangeline Dakin M.D.   On: 10/20/2013 16:35        EKG  Interpretation   Date/Time:  Wednesday November 05 2013 14:04:36 EDT Ventricular Rate:  142 PR Interval:  152 QRS Duration: 73 QT Interval:  304 QTC Calculation: 467 R Axis:   63 Text Interpretation:  Atrial fibrillation with fast ventricular response  Paired ventricular premature complexes Minimal ST depression, inferior  leads Baseline wander in lead(s) II aVR aVF Since last tracing 10/18/2013   Atrial fibrillation has replaced Normal sinus rhythm Confirmed by [place]   MD-I, Malva Diesing ([id number]) on 11/05/2013 4:02:12 PM      MDM  patient presents with new onset atrial fibrillation with very rapid  response up to the 220 range. She is on a diltiazem drip with more control of her rate however she is is still not in a normal sinus rhythm. Her thyroid tests and electrolytes are normal. She does not have a PE although she was at risk of her recent surgery. Her troponin is negative. The etiology of her atrial fibrillation is unclear at this time. She is being admitted for further evaluation.    Final diagnoses:  Atrial fibrillation with rapid ventricular response     Plan admission   CRITICAL CARE Performed by: Rolland Porter L Total critical care time: 39 min Critical care time was exclusive of separately billable procedures and treating other patients. Critical care was necessary to treat or prevent imminent or life-threatening deterioration. Critical care was time spent personally by me on the following activities: development of treatment plan with patient and/or surrogate as well as nursing, discussions with consultants, evaluation of patient's response to treatment, examination of patient, obtaining history from patient or surrogate, ordering and performing treatments and interventions, ordering and review of laboratory studies, ordering and review of radiographic studies, pulse oximetry and re-evaluation of patient's condition.  Rolland Porter, MD, FACEP   Janice Norrie, MD 11/05/13 [id number]

## 2013-11-05 NOTE — ED Notes (Signed)
Portable chest xray at bedside.

## 2013-11-05 NOTE — ED Notes (Signed)
Per EMS - pt coming from [facility]. Pt was sitting in wheelchair upon arrival. Pt had sx 3 weeks ago on left leg. Pt wearing boot on left foot. Pt showing a.fib on monitor, no hx of a.fib. sts she has been under a lot of stress lately and hx of anxiety. HR 109-248. EMS wanted to administered Adenosine but pt refused. Pt c/o chest tightness. Urgent care administered 324 mg of ASA. EMS administered 20 mg of Cardizem, pt felt much better. 15 mins later administered 5 mg of Cardizem. O2 sats 96-100% on NRM, ems placed on NRM because pt c/o sob. 20G in right hand. BP 147/96.

## 2013-11-05 NOTE — H&P (Signed)
[facility] History and Physical  Natalie Cobb ZOX:[id number] DOB: September 22, 1960 DOA: 11/05/2013  Referring physician: Emergency Department PCP: No PCP Per Patient  Specialists:   Chief Complaint: Palpitations  HPI: Natalie Cobb is a 53 y.o. female  With a hx of  Htn, anxiety, and recent surgery for ankle fracture who presents to the ED with complaints of palpitations and chest discomfort. In the Ed, the patient was noted to have afib with RVR, requiring IV cardizem boluses and ultimately started on a cardizem gtt. Cardiac enzymes were initially normal. TSH was normal. CTA chest was neg for PE. As the patient is currently cardizem gtt dependent, the hospitalist was consulted for admission.  On further questioning, pt reports long hx of anxiety and panic attacks. Was previously prescribed amitriptyline but recently stopped secondary to palpitations and constipation. Last dose of amitriptyline was last week. Also has been on Wellbutrin over the past year. Reports Holter monitor around 2002, ordered for palpitations, found to be unremarkable. Pt also admits to smoking marijuana frequently. Denies other drugs of abuse.  Review of Systems:  Per above, the remainder of the 10pt ros reviewed and are neg  Past Medical History  Diagnosis Date  . Hypertension   . Diverticulitis   . GERD (gastroesophageal reflux disease)   . Complication of anesthesia     did not get to sleep brfore Colonscopy  . Anxiety     anxiety attacks  . Asthma   . Depression   . IBS (irritable bowel syndrome)   . Hernia of abdominal wall   . Cancer     Wilms tumor 27months   Past Surgical History  Procedure Laterality Date  . Nephectomy    . Spleenectomy    . Tubal ligation    . Wisdom tooth extraction    . Revision of scar      of abdominal scar age 24  . Lymph gland excision      neck in 20's  . Orif ankle fracture Right 10/18/2013    Procedure: OPEN REDUCTION INTERNAL FIXATION (ORIF) RIGHT TRIMALLEOLAR  ANKLE FRACTURE;  Surgeon: Newt Minion, MD;  Location: [facility];  Service: Orthopedics;  Laterality: Right;   Social History:  reports that she has quit smoking. She does not have any smokeless tobacco history on file. She reports that she drinks alcohol. She reports that she uses illicit drugs (Marijuana).  where does patient live--home, ALF, SNF? and with whom if at home?  Can patient participate in ADLs?  Allergies  Allergen Reactions  . Compazine [Prochlorperazine Edisylate] Other (See Comments)    Patient has "muscular" reaction to all medications that end in "ZINE"  . Meclizine     Patient has "muscular" reaction to all medications that end in "ZINE"  . Thorazine [Chlorpromazine] Other (See Comments)    Patient has "muscular" reaction to all medications that end in "ZINE"  . Sulfa Antibiotics Rash    No family history on file.  (be sure to complete)  Prior to Admission medications   Medication Sig Start Date End Date Taking? Authorizing Provider  albuterol (PROAIR HFA) 108 (90 BASE) MCG/ACT inhaler Inhale 2 puffs into the lungs every 6 (six) hours as needed for wheezing or shortness of breath.   Yes Historical Provider, MD  amitriptyline (ELAVIL) 10 MG tablet Take 5 mg by mouth daily.    Yes Historical Provider, MD  aspirin EC 325 MG tablet Take 325 mg by mouth daily.   Yes Historical Provider, MD  buPROPion (WELLBUTRIN XL) 150 MG 24 hr tablet Take 150 mg by mouth every evening.   Yes Historical Provider, MD  Cholecalciferol (VITAMIN D-3) 5000 UNITS TABS Take 5,000 Units by mouth every evening.    Yes Historical Provider, MD  clonazePAM (KLONOPIN) 1 MG tablet Take 1 mg by mouth 2 (two) times daily.   Yes Historical Provider, MD  dicyclomine (BENTYL) 20 MG tablet Take 20 mg by mouth daily as needed (for stomach pain).   Yes Historical Provider, MD  fish oil-omega-3 fatty acids 1000 MG capsule Take 1 g by mouth every evening.    Yes Historical Provider, MD  metoprolol tartrate  (LOPRESSOR) 25 MG tablet Take 25 mg by mouth 2 (two) times daily.   Yes Historical Provider, MD  oxyCODONE-acetaminophen (PERCOCET/ROXICET) 5-325 MG per tablet Take 1 tablet by mouth every 4 (four) hours as needed for severe pain.   Yes Historical Provider, MD  pantoprazole (PROTONIX) 40 MG tablet Take 40 mg by mouth 2 (two) times daily.   Yes Historical Provider, MD  simvastatin (ZOCOR) 40 MG tablet Take 40 mg by mouth every evening.   Yes Historical Provider, MD   Physical Exam: Filed Vitals:   11/05/13 1645 11/05/13 1700 11/05/13 1745 11/05/13 1800  BP: 130/72 138/87 135/67 148/57  Pulse: 79 138 99 145  Temp:      TempSrc:      Resp: 17 17 15 12   SpO2: 95% 96% 97% 98%     General:  Awake, in nad  Eyes: PERRL B   ENT: Membranes moist, dentition fair  Neck: trachea midline, neck supple  Cardiovascular: tachycardic, irregularly irregular, s1, s2  Respiratory: normal resp effort, no wheezing  Abdomen: soft, nondistended  Skin: normal skin turgor, no abnormal skin lesions seen  Musculoskeletal: perfused, no clubbing, s/p foot orthotic over RLE  Psychiatric: appears anxious, no auditory/visual hallucinations  Neurologic: cn2-12 grossly intact, strength/sensation intact  Labs on Admission:  Basic Metabolic Panel:  Recent Labs Lab 11/05/13 1417  NA 140  K 4.5  CL 100  CO2 22  GLUCOSE 116*  BUN 12  CREATININE 0.90  CALCIUM 9.9  MG 1.8   Liver Function Tests:  Recent Labs Lab 11/05/13 1417  AST 21  ALT 12  ALKPHOS 96  BILITOT 1.4*  PROT 8.4*  ALBUMIN 3.4*   No results found for this basename: LIPASE, AMYLASE,  in the last 168 hours No results found for this basename: AMMONIA,  in the last 168 hours CBC:  Recent Labs Lab 11/05/13 1417  WBC 18.0*  HGB 13.9  HCT 39.9  MCV 89.9  PLT 568*   Cardiac Enzymes: No results found for this basename: CKTOTAL, CKMB, CKMBINDEX, TROPONINI,  in the last 168 hours  BNP (last 3 results) No results found for  this basename: PROBNP,  in the last 8760 hours CBG: No results found for this basename: GLUCAP,  in the last 168 hours  Radiological Exams on Admission: Ct Angio Chest W/cm &/or Wo Cm  11/05/2013   CLINICAL DATA:  Chest pain, recent left ankle fracture with surgery, new onset AFib, evaluate for PE  EXAM: CT ANGIOGRAPHY CHEST WITH CONTRAST  TECHNIQUE: Multidetector CT imaging of the chest was performed using the standard protocol during bolus administration of intravenous contrast. Multiplanar CT image reconstructions and MIPs were obtained to evaluate the vascular anatomy.  CONTRAST:  143mL OMNIPAQUE IOHEXOL 350 MG/ML SOLN  COMPARISON:  Chest radiograph dated 11/05/2012  FINDINGS: No evidence of pulmonary embolism.  Lungs  are essentially clear. Mild subpleural reticulation/ scarring in the right middle lobe and lingula. Mild mosaic attenuation. No pleural effusion or pneumothorax.  Visualized thyroid is unremarkable.  The heart is normal size.  No pericardial effusion.  No suspicious mediastinal, hilar, or axillary lymphadenopathy.  Visualized upper abdomen node grossly unremarkable.  Visualized osseous structures are within normal limits.  Review of the MIP images confirms the above findings.  IMPRESSION: No evidence of pulmonary embolism.  No evidence of acute cardiopulmonary disease.   Electronically Signed   By: Julian Hy M.D.   On: 11/05/2013 17:52   Dg Chest Port 1 View  11/05/2013   CLINICAL DATA:  Chest pain  EXAM: PORTABLE CHEST - 1 VIEW  COMPARISON:  October 18, 2013  FINDINGS: The lungs are borderline hyperexpanded but clear. Heart size and pulmonary vascularity are normal. No adenopathy. No pneumothorax. No bone lesions.  IMPRESSION: No edema or consolidation.   Electronically Signed   By: Lowella Grip M.D.   On: 11/05/2013 14:25    EKG: Independently reviewed. Afib  Assessment/Plan Principal Problem:   Atrial fibrillation with rapid ventricular response Active Problems:   HTN  (hypertension)   Anxiety   Hx of fracture of ankle   1. Afib w/ RVR 1. TSH and CTA chest both unremarkable 2. Will follow serial cardiac enzymes - initial is neg 3. Continue on cardizem gtt with plans to titrate and convert to oral as tolerated 4. Consider increasing home beta blocker dose to 50mg  bid (did not take home dose this AM) 5. Will check 2d echo 6. CHADS-VASC of 2 (htn, female) - will cont on empiric asa for now 7. May consider Cardiology consultation in the AM 8. For now, will admit to stepdown, tele, inpt status 2. HTN 1. BP stable 2. Cont home meds for now, increasing metoprolol to 50mg  secondary to above 3. Anxiety 1. Cont with PRN benzos for now 2. Will hold wellbutrin and amitriptyline secondary to above tachyarrhythmia 4. Hx ankle fracture 1. Stable 5. DVT prophylaxis 1. Heparin subQ  Code Status: Full (must indicate code status--if unknown or must be presumed, indicate so) Family Communication: Pt in room (indicate person spoken with, if applicable, with phone number if by telephone) Disposition Plan: Pending (indicate anticipated LOS)  Time spent: 71min  [facility], [facility] Pager [phone]  If 7PM-7AM, please contact night-coverage [email or web address] Password [email or web address] 11/05/2013, 6:20 PM

## 2013-11-05 NOTE — ED Notes (Signed)
Pt reports that she wasn't feeling good yesterday, having nausea and chills. Pt reports that this morning she woke up with chest tightness and sob. Pt had recent sx. Still wearing boot on foot. Pt denies chest pain but sts sometimes she is having chest tightness. Pt c/o sob and feeling a little lightheaded. Pt is pale and diaphoretic. Nad, resp e/u.

## 2013-11-05 NOTE — ED Notes (Signed)
Report attempted x 1

## 2013-11-06 DIAGNOSIS — I519 Heart disease, unspecified: Secondary | ICD-10-CM

## 2013-11-06 DIAGNOSIS — Z8781 Personal history of (healed) traumatic fracture: Secondary | ICD-10-CM

## 2013-11-06 DIAGNOSIS — I10 Essential (primary) hypertension: Secondary | ICD-10-CM

## 2013-11-06 DIAGNOSIS — I4891 Unspecified atrial fibrillation: Principal | ICD-10-CM

## 2013-11-06 LAB — COMPREHENSIVE METABOLIC PANEL
ALT: 11 U/L (ref 0–35)
AST: 17 U/L (ref 0–37)
Albumin: 2.7 g/dL — ABNORMAL LOW (ref 3.5–5.2)
Alkaline Phosphatase: 84 U/L (ref 39–117)
BUN: 13 mg/dL (ref 6–23)
CO2: 19 mEq/L (ref 19–32)
Calcium: 9.5 mg/dL (ref 8.4–10.5)
Chloride: 102 mEq/L (ref 96–112)
Creatinine, Ser: 0.75 mg/dL (ref 0.50–1.10)
GFR calc Af Amer: >90 mL/min (ref >90)
GFR calc non Af Amer: >90 mL/min (ref >90)
Glucose, Bld: 94 mg/dL (ref 70–99)
POTASSIUM: 4.2 meq/L (ref 3.7–5.3)
SODIUM: 139 meq/L (ref 137–147)
TOTAL PROTEIN: 7.1 g/dL (ref 6.0–8.3)
Total Bilirubin: 1.3 mg/dL — ABNORMAL HIGH (ref 0.3–1.2)

## 2013-11-06 LAB — CBC
HCT: 37.3 % (ref 36.0–46.0)
Hemoglobin: 12.5 g/dL (ref 12.0–15.0)
MCH: 30.4 pg (ref 26.0–34.0)
MCHC: 33.5 g/dL (ref 30.0–36.0)
MCV: 90.8 fL (ref 78.0–100.0)
Platelets: 505 10*3/uL — ABNORMAL HIGH (ref 150–400)
RBC: 4.11 MIL/uL (ref 3.87–5.11)
RDW: 14.5 % (ref 11.5–15.5)
WBC: 16.3 10*3/uL — AB (ref 4.0–10.5)

## 2013-11-06 LAB — TROPONIN I
Troponin I: <0.3 ng/mL (ref <0.30)
Troponin I: <0.3 ng/mL (ref <0.30)

## 2013-11-06 LAB — RAPID URINE DRUG SCREEN, HOSP PERFORMED
AMPHETAMINES: NOT DETECTED
BARBITURATES: NOT DETECTED
Benzodiazepines: POSITIVE — AB
Cocaine: NOT DETECTED
OPIATES: NOT DETECTED
TETRAHYDROCANNABINOL: POSITIVE — AB

## 2013-11-06 MED ORDER — METOPROLOL TARTRATE 50 MG PO TABS: 50.0000 mg | ORAL_TABLET | Freq: Two times a day (BID) | ORAL | Status: DC

## 2013-11-06 MED ORDER — APIXABAN 5 MG PO TABS: 5.0000 mg | ORAL_TABLET | Freq: Two times a day (BID) | ORAL | Status: DC

## 2013-11-06 NOTE — Care Management Note (Signed)
    Page 1 of 1   11/06/2013     7:37:27 AM   CARE MANAGEMENT NOTE 11/06/2013  Patient:  CHIRSTINE, DEFRAIN   Account Number:  [id number]  Date Initiated:  11/06/2013  Documentation initiated by:  Elissa Hefty  Subjective/Objective Assessment:   adm w at fib w rvr     Action/Plan:   lives w fam   Anticipated DC Date:     Anticipated DC Plan:  HOME/SELF CARE         Choice offered to / List presented to:             Status of service:   Medicare Important Message given?   (If response is "NO", the following Medicare IM given date fields will be blank) Date Medicare IM given:   Date Additional Medicare IM given:    Discharge Disposition:    Per UR Regulation:  Reviewed for med. necessity/level of care/duration of stay  If discussed at [place] of Stay Meetings, dates discussed:    Comments:

## 2013-11-06 NOTE — Progress Notes (Signed)
Patient complains of intermittent pain in her right leg post ankle surgery, describes a knot formed, that now has subsided, but is concerned about having a possible blood clot post surgery.

## 2013-11-06 NOTE — Discharge Summary (Signed)
Physician Discharge Summary  Natalie Cobb RXY:[id number] DOB: Dec 29, 1960 DOA: 11/05/2013  PCP: [name], FNP  Admit date: 11/05/2013 Discharge date: 11/06/2013  Time spent: >45 minutes  Recommendations for Outpatient Follow-up:  1. See PCP in 1 wk  Discharge Diagnoses:  Principal Problem:   Atrial fibrillation with rapid ventricular response Active Problems:   HTN (hypertension)   Anxiety   Hx of fracture of ankle   Discharge Condition: stable  Diet recommendation: heart healthy  Filed Weights   11/05/13 2000  Weight: 80.7 kg (177 lb 14.6 oz)    History of present illness:  53 y/o female with hx of Htn, anxiety, and recent surgery for eight ankle fracture who presents to the ED with complaints of palpitations and chest discomfort. In the Ed, the patient was noted to have afib with RVR, requiring IV cardizem boluses and ultimately started on a cardizem gtt. Cardiac enzymes were initially normal. TSH was normal. CTA chest was neg for PE. As the patient is currently cardizem gtt dependent, the hospitalist was consulted for admission.  On further questioning, pt reports long hx of anxiety and panic attacks. Was previously prescribed amitriptyline but recently stopped secondary to palpitations and constipation. Last dose of amitriptyline was last week. Also has been on Wellbutrin over the past year. Reports Holter monitor around 2002, ordered for palpitations, found to be unremarkable. Pt also admits to smoking marijuana frequently. Denies other drugs of abuse.   Hospital Course:  Palpitations/ PAF - patient's Metoprolol was doubled and CArdizem infusion started. She converted to sinus rhythm in a matter of hours - C2HADS2VAsc is 2 therefore will need long term Eliquis- Especially as this is PAF- pt can sense the palpitations and had been noting them at home-  given script and coupon for Eliquis $10/mo copay x 2 yrs - ECHO WNL --Study Conclusions  Left ventricle: The cavity  size was normal. Wall thickness was normal. Systolic function was normal. The estimated ejection fraction was in the range of 55% to 60%. Wall motion was normal; there were no regional wall motion abnormalities. Doppler parameters are consistent with abnormal left ventricular relaxation (grade 1 diastolic dysfunction).   HTN - as noted Metoprolol increased from 25 BID to 50 BID  Anxiety - has held elavil on her own- cont Wellbutrin and Clonazepam  Ankle fx - has been managing well at home - on ASA for DVT prophylaxis after fracture- will d/c this now   Discharge Exam: Filed Vitals:   11/06/13 1200  BP: 126/70  Pulse:   Temp: 97.9 F (36.6 C)  Resp:     General: AAO x 3 Cardiovascular: RRR, no murmurs Respiratory: CTA b/l   Discharge Instructions      Discharge Orders   Future Orders Complete By Expires   Diet - low sodium heart healthy  As directed    Increase activity slowly  As directed        Medication List    STOP taking these medications       amitriptyline 10 MG tablet  Commonly known as:  ELAVIL     aspirin EC 325 MG tablet      TAKE these medications       apixaban 5 MG Tabs tablet  Commonly known as:  ELIQUIS  Take 1 tablet (5 mg total) by mouth 2 (two) times daily.     buPROPion 150 MG 24 hr tablet  Commonly known as:  WELLBUTRIN XL  Take 150 mg by mouth every  evening.     clonazePAM 1 MG tablet  Commonly known as:  KLONOPIN  Take 1 mg by mouth 2 (two) times daily.     dicyclomine 20 MG tablet  Commonly known as:  BENTYL  Take 20 mg by mouth daily as needed (for stomach pain).     fish oil-omega-3 fatty acids 1000 MG capsule  Take 1 g by mouth every evening.     metoprolol 50 MG tablet  Commonly known as:  LOPRESSOR  Take 1 tablet (50 mg total) by mouth 2 (two) times daily.     oxyCODONE-acetaminophen 5-325 MG per tablet  Commonly known as:  PERCOCET/ROXICET  Take 1 tablet by mouth every 4 (four) hours as needed for severe  pain.     pantoprazole 40 MG tablet  Commonly known as:  PROTONIX  Take 40 mg by mouth 2 (two) times daily.     PROAIR HFA 108 (90 BASE) MCG/ACT inhaler  Generic drug:  albuterol  Inhale 2 puffs into the lungs every 6 (six) hours as needed for wheezing or shortness of breath.     simvastatin 40 MG tablet  Commonly known as:  ZOCOR  Take 40 mg by mouth every evening.     Vitamin D-3 5000 UNITS Tabs  Take 5,000 Units by mouth every evening.       Allergies  Allergen Reactions  . Compazine [Prochlorperazine Edisylate] Other (See Comments)    Patient has "muscular" reaction to all medications that end in "ZINE"  . Meclizine     Patient has "muscular" reaction to all medications that end in "ZINE"  . Thorazine [Chlorpromazine] Other (See Comments)    Patient has "muscular" reaction to all medications that end in "ZINE"  . Sulfa Antibiotics Rash      The results of significant diagnostics from this hospitalization (including imaging, microbiology, ancillary and laboratory) are listed below for reference.    Significant Diagnostic Studies: Dg Chest 2 View  10/18/2013   CLINICAL DATA:  "SSA".  Ankle repair.  Hypertension.  EXAM: CHEST  2 VIEW  COMPARISON:  None.  FINDINGS: Normal heart size and mediastinal contours. No acute infiltrate or edema. No effusion or pneumothorax. No acute osseous findings.  IMPRESSION: No active cardiopulmonary disease.   Electronically Signed   By: Tiburcio Pea M.D.   On: 10/18/2013 07:28   Dg Knee 1-2 Views Right  10/15/2013   CLINICAL DATA Right ankle deformity, fall  EXAM RIGHT KNEE - 1-2 VIEW  COMPARISON None  FINDINGS AP and cross-table lateral views.  Diffuse osseous demineralization.  Minimal joint space narrowing right knee.  No acute fracture, dislocation or bone destruction.  No knee joint effusion.  IMPRESSION No acute osseous abnormalities.  SIGNATURE  Electronically Signed   By: Ulyses Southward M.D.   On: 10/15/2013 15:32   Dg Ankle 2 Views  Right  10/15/2013   CLINICAL DATA Postreduction  EXAM RIGHT ANKLE - 2 VIEW  COMPARISON Radiography from today at 3:20 p.m.  FINDINGS Relocated tibiotalar joint. Medial malleolar and distal fibular fractures are also better aligned. The medial malleolar fracture remains displaced 50%, with widening of the ankle mortise. No new osseous abnormality seen.  IMPRESSION Relocated tibiotalar joint. Improved distal fibula and tibia fracture alignment.  SIGNATURE  Electronically Signed   By: Tiburcio Pea M.D.   On: 10/15/2013 18:09   Dg Ankle 2 Views Right  10/15/2013   CLINICAL DATA Right ankle deformity post fall today  EXAM RIGHT ANKLE - 2  VIEW  COMPARISON None  FINDINGS AP and cross-table lateral views.  Medial malleolar fracture with significant lateral displacement.  Oblique distal right fibular metadiaphyseal fracture with significant posterior and lateral displacement and apex medial and anterior angulation.  Lateral dislocation of the talus at the tibiotalar joint.  Questionable small posterior malleolar fracture on single view.  Bones appear demineralized.  Prior superior intact.  IMPRESSION Bimalleolar versus trimalleolar fracture-dislocation right ankle as above.  SIGNATURE  Electronically Signed   By: Lavonia Dana M.D.   On: 10/15/2013 15:34   Ct Head Wo Contrast  10/15/2013   CLINICAL DATA 53 year old who fell down approximately 5 stairs.  EXAM CT HEAD WITHOUT CONTRAST  CT CERVICAL SPINE WITHOUT CONTRAST  TECHNIQUE Multidetector CT imaging of the head and cervical spine was performed following the standard protocol without intravenous contrast. Multiplanar CT image reconstructions of the cervical spine were also generated.  COMPARISON CT head and cervical spine 07/18/2008.  FINDINGS CT HEAD FINDINGS  Ventricular system normal in size and appearance for age. No mass lesion. No midline shift. No acute hemorrhage or hematoma. No extra-axial fluid collections. No evidence of acute infarction. No focal brain  parenchymal abnormalities.  No skull fractures or other focal osseous abnormalities involving the skull. Visualized paranasal sinuses, bilateral mastoid air cells, and bilateral middle ear cavities well-aerated.  CT CERVICAL SPINE FINDINGS  No fractures identified involving the cervical spine. Slight reversal of the usual lordosis centered at C5-6. Anatomic alignment. No spinal stenosis. Disc space narrowing and endplate hypertrophic changes at C5-6 (severe), progressive since the prior examination. Facet joints intact throughout. Coronal reformatted images demonstrate an intact craniocervical junction, intact C1-C2 articulation, intact dens, and intact lateral masses throughout. No gross disc protrusions on the soft tissue window images. Moderate bilateral foraminal stenoses at C5-6 due to uncinate hypertrophy.  IMPRESSION CT Head:  1.  Normal examination.  CT Cervical Spine:  1. No cervical spine fractures identified. 2. Severe degenerative disc disease and spondylosis at C5-6 with moderate bilateral foraminal stenoses at this level, progressive since 2009.  SIGNATURE  Electronically Signed   By: Evangeline Dakin M.D.   On: 10/15/2013 18:10   Ct Angio Chest W/cm &/or Wo Cm  11/05/2013   CLINICAL DATA:  Chest pain, recent left ankle fracture with surgery, new onset AFib, evaluate for PE  EXAM: CT ANGIOGRAPHY CHEST WITH CONTRAST  TECHNIQUE: Multidetector CT imaging of the chest was performed using the standard protocol during bolus administration of intravenous contrast. Multiplanar CT image reconstructions and MIPs were obtained to evaluate the vascular anatomy.  CONTRAST:  172mL OMNIPAQUE IOHEXOL 350 MG/ML SOLN  COMPARISON:  Chest radiograph dated 11/05/2012  FINDINGS: No evidence of pulmonary embolism.  Lungs are essentially clear. Mild subpleural reticulation/ scarring in the right middle lobe and lingula. Mild mosaic attenuation. No pleural effusion or pneumothorax.  Visualized thyroid is unremarkable.  The  heart is normal size.  No pericardial effusion.  No suspicious mediastinal, hilar, or axillary lymphadenopathy.  Visualized upper abdomen node grossly unremarkable.  Visualized osseous structures are within normal limits.  Review of the MIP images confirms the above findings.  IMPRESSION: No evidence of pulmonary embolism.  No evidence of acute cardiopulmonary disease.   Electronically Signed   By: Julian Hy M.D.   On: 11/05/2013 17:52   Ct Cervical Spine Wo Contrast  10/20/2013   CLINICAL DATA:  53 year old who fell down approximately 5 stairs.  EXAM: CT HEAD WITHOUT CONTRAST  CT CERVICAL SPINE WITHOUT CONTRAST  TECHNIQUE:  Multidetector CT imaging of the head and cervical spine was performed following the standard protocol without intravenous contrast. Multiplanar CT image reconstructions of the cervical spine were also generated.  COMPARISON:  CT head and cervical spine 07/18/2008.  FINDINGS: CT HEAD FINDINGS  Ventricular system normal in size and appearance for age. No mass lesion. No midline shift. No acute hemorrhage or hematoma. No extra-axial fluid collections. No evidence of acute infarction. No focal brain parenchymal abnormalities.  No skull fractures or other focal osseous abnormalities involving the skull. Visualized paranasal sinuses, bilateral mastoid air cells, and bilateral middle ear cavities well-aerated.  CT CERVICAL SPINE FINDINGS  No fractures identified involving the cervical spine. Slight reversal of the usual lordosis centered at C5-6. Anatomic alignment. No spinal stenosis. Disc space narrowing and endplate hypertrophic changes at C5-6 (severe), progressive since the prior examination. Facet joints intact throughout. Coronal reformatted images demonstrate an intact craniocervical junction, intact C1-C2 articulation, intact dens, and intact lateral masses throughout. No gross disc protrusions on the soft tissue window images. Moderate bilateral foraminal stenoses at C5-6 due to  uncinate hypertrophy.  IMPRESSION: CT Head:  1. Normal examination.  CT Cervical Spine:  1. No cervical spine fractures identified. 2. Severe degenerative disc disease and spondylosis at C5-6 with moderate bilateral foraminal stenoses at this level, progressive since 2009.   Electronically Signed   By: Evangeline Dakin M.D.   On: 10/20/2013 16:35   Dg Chest Port 1 View  11/05/2013   CLINICAL DATA:  Chest pain  EXAM: PORTABLE CHEST - 1 VIEW  COMPARISON:  October 18, 2013  FINDINGS: The lungs are borderline hyperexpanded but clear. Heart size and pulmonary vascularity are normal. No adenopathy. No pneumothorax. No bone lesions.  IMPRESSION: No edema or consolidation.   Electronically Signed   By: Lowella Grip M.D.   On: 11/05/2013 14:25    Microbiology: Recent Results (from the past 240 hour(s))  MRSA PCR SCREENING     Status: None   Collection Time    11/05/13  7:55 PM      Result Value Ref Range Status   MRSA by PCR NEGATIVE  NEGATIVE Final   Comment:            The GeneXpert MRSA Assay (FDA     approved for NASAL specimens     only), is one component of a     comprehensive MRSA colonization     surveillance program. It is not     intended to diagnose MRSA     infection nor to guide or     monitor treatment for     MRSA infections.     Labs: Basic Metabolic Panel:  Recent Labs Lab 11/05/13 1417 11/06/13 0247  NA 140 139  K 4.5 4.2  CL 100 102  CO2 22 19  GLUCOSE 116* 94  BUN 12 13  CREATININE 0.90 0.75  CALCIUM 9.9 9.5  MG 1.8  --    Liver Function Tests:  Recent Labs Lab 11/05/13 1417 11/06/13 0247  AST 21 17  ALT 12 11  ALKPHOS 96 84  BILITOT 1.4* 1.3*  PROT 8.4* 7.1  ALBUMIN 3.4* 2.7*   No results found for this basename: LIPASE, AMYLASE,  in the last 168 hours No results found for this basename: AMMONIA,  in the last 168 hours CBC:  Recent Labs Lab 11/05/13 1417 11/06/13 0247  WBC 18.0* 16.3*  HGB 13.9 12.5  HCT 39.9 37.3  MCV 89.9 90.8  PLT  568*  505*   Cardiac Enzymes:  Recent Labs Lab 11/05/13 2214 11/06/13 0247 11/06/13 0845  TROPONINI <0.30 <0.30 <0.30   BNP: BNP (last 3 results) No results found for this basename: PROBNP,  in the last 8760 hours CBG: No results found for this basename: GLUCAP,  in the last 168 hours     Signed:  Debbe Odea, MD  [facility] 11/06/2013, 5:51 PM

## 2013-11-06 NOTE — Progress Notes (Signed)
*  PRELIMINARY RESULTS* Echocardiogram 2D Echocardiogram has been performed.  Natalie Cobb 11/06/2013, 10:27 AM

## 2013-11-06 NOTE — Plan of Care (Signed)
Problem: Food- and Nutrition-Related Knowledge Deficit (NB-1.1) Goal: Nutrition education Formal process to instruct or train a patient/client in a skill or to impart knowledge to help patients/clients voluntarily manage or modify food choices and eating behavior to maintain or improve health. Outcome: Progressing Nutrition Education Note  RD drawn to chart for Malnutrition Score.  Pt states she has lost ~20 lbs in the past 2-3 months.  Wt loss of 6.8% is not significant for time frame. Pt does endorse poor intake r/t altered taste since taking a new medication for the past 2 months.   Lipid Panel  No results found for this basename: chol, trig, hdl, cholhdl, vldl, ldlcalc    RD reviewed patient's dietary recall. Provided examples on ways to decrease sodium and fat intake in diet. Discussed wt loss and adequate intake.  Discussed nutrition management for IBS.  Encouraged pt to maintain wt for the next 1-2 months.  Teach back method used.  Expect good compliance.  Body mass index is 31.52 kg/(m^2). Pt meets criteria for overweight based on current BMI.  Current diet order is Heart Healthy, patient is consuming approximately 50% of meals at this time. Labs and medications reviewed. No further nutrition interventions warranted at this time. RD contact information provided. If additional nutrition issues arise, please re-consult RD.  Brynda Greathouse, MS RD LDN Clinical Inpatient Dietitian Pager: [phone] Weekend/After hours pager: [phone]

## 2014-12-12 IMAGING — CR DG CHEST 1V PORT
1 series · 1 of 1 positions shown · non-contrast
Comparison: October 18, 2013

CLINICAL DATA: Chest pain

EXAM:
PORTABLE CHEST - 1 VIEW

[AP]
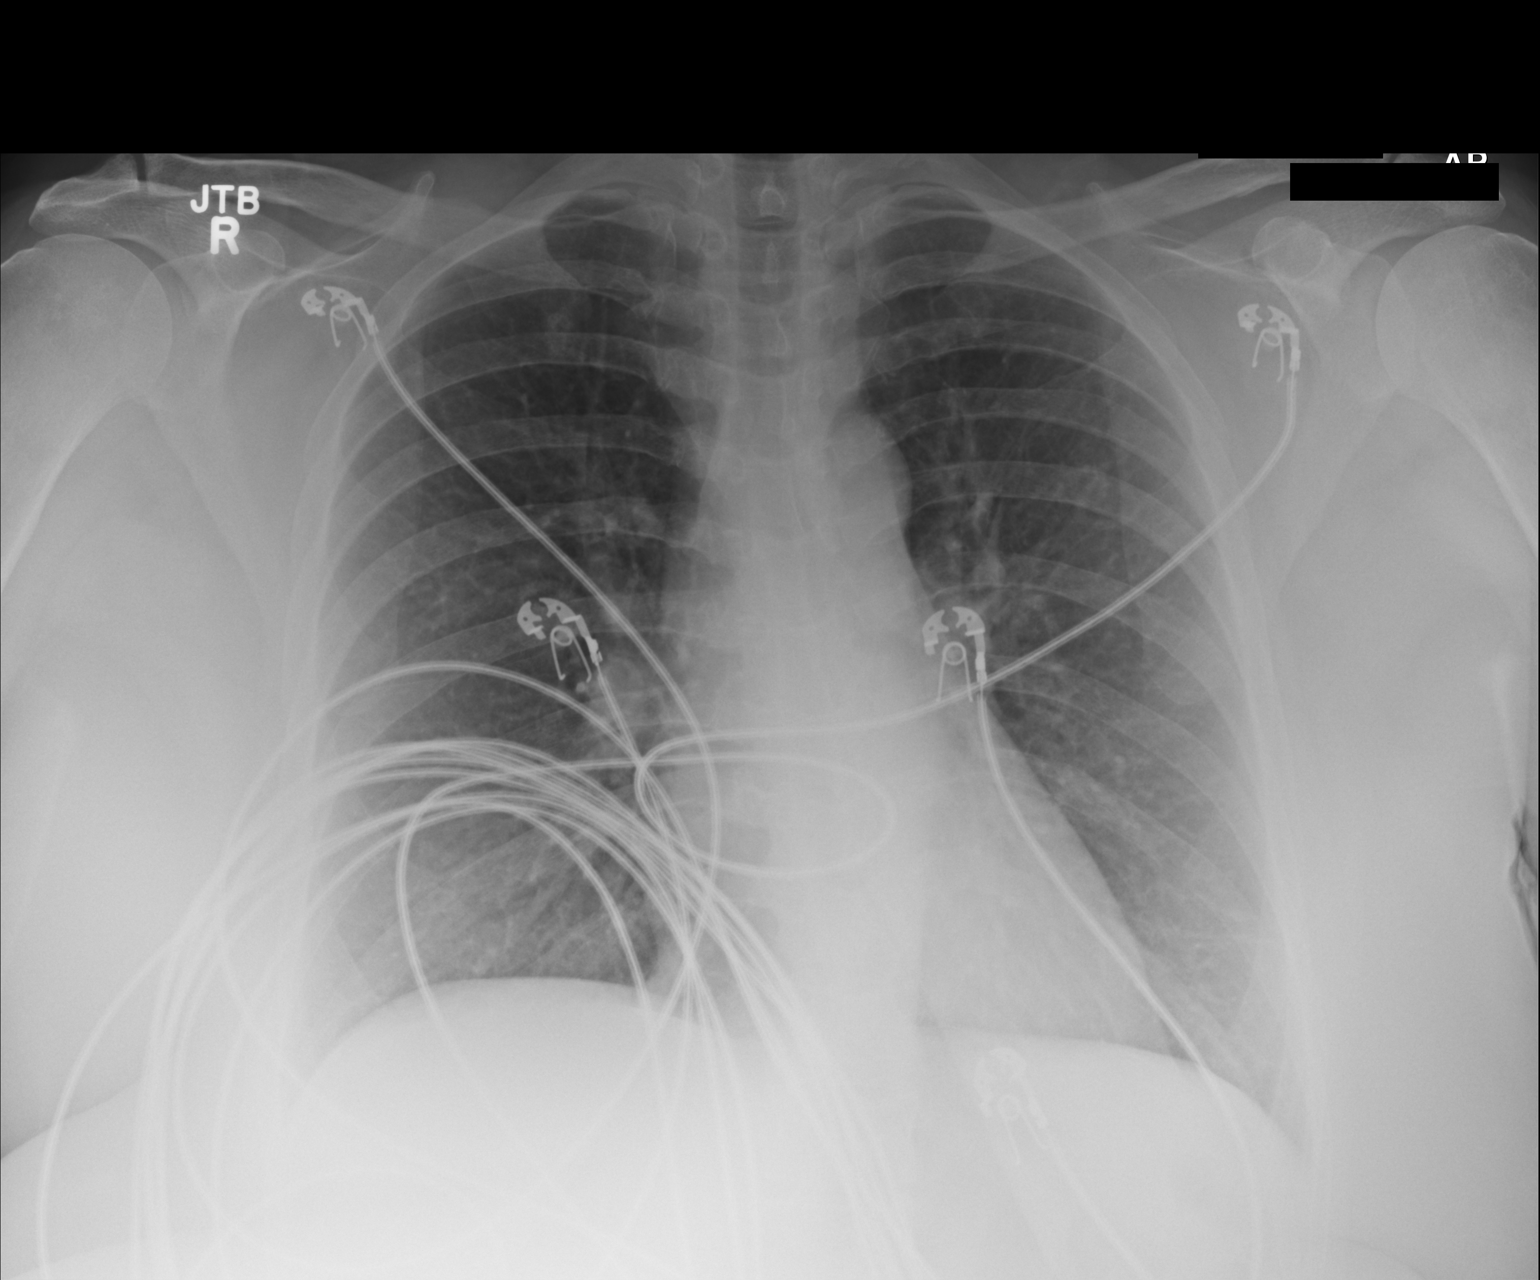

[1 of 1 positions shown; findings below may reference images not displayed]

FINDINGS: The lungs are borderline hyperexpanded but clear. Heart size and
pulmonary vascularity are normal. No adenopathy. No pneumothorax. No
bone lesions.
IMPRESSION: No edema or consolidation.

## 2014-12-12 IMAGING — CT CT ANGIO CHEST
2 of 9 series · 19 of 46 positions shown · IV contrast (Omni 300)
Comparison: Chest radiograph dated 11/05/2012

CLINICAL DATA: Chest pain, recent left ankle fracture with surgery,
new onset AFib, evaluate for PE

EXAM:
CT ANGIOGRAPHY CHEST WITH CONTRAST
TECHNIQUE: Multidetector CT imaging of the chest was performed using the
standard protocol during bolus administration of intravenous
contrast. Multiplanar CT image reconstructions and MIPs were
obtained to evaluate the vascular anatomy.
CONTRAST:  100mL OMNIPAQUE IOHEXOL 350 MG/ML SOLN

[Series 6: thins · axial · 0.56mm/px · z∈[+1021,+1230]mm · 16 of 231 slices shown]
[im 11/231  lung]
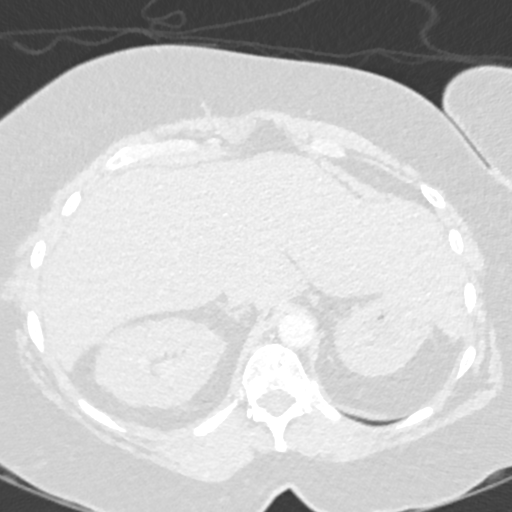
[im 22/231  soft-tissue]
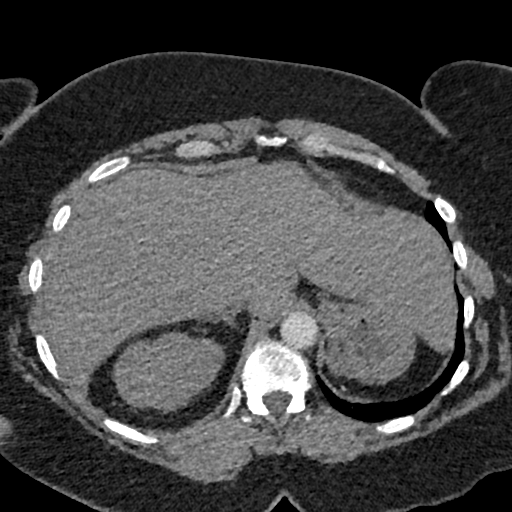
[im 44/231  lung]
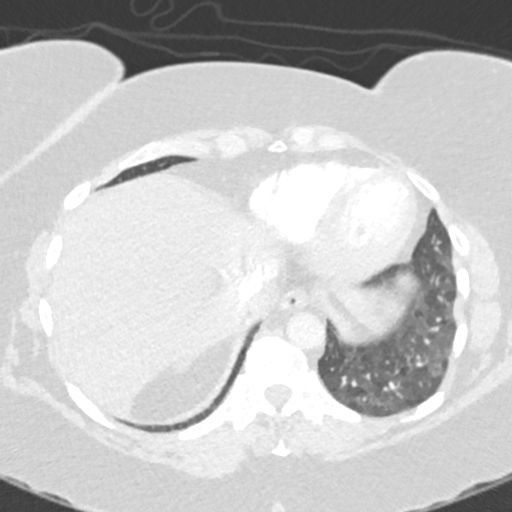
[im 55/231  soft-tissue]
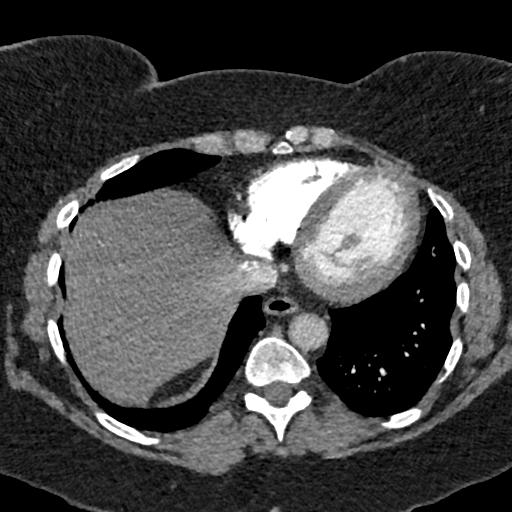
[im 66/231  lung]
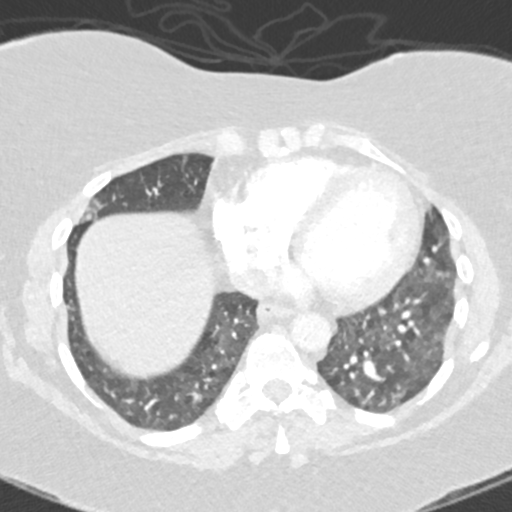
[im 77/231  soft-tissue]
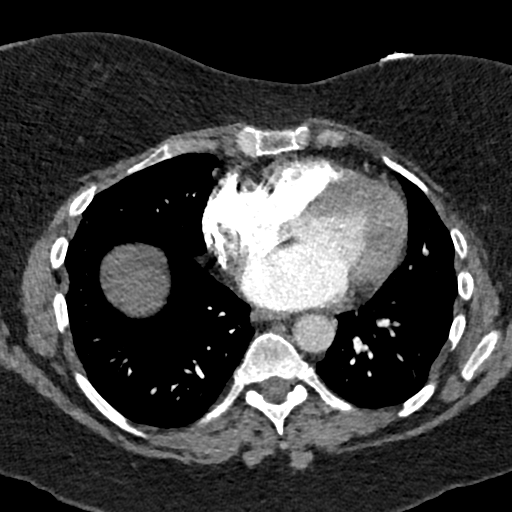
[im 99/231  lung]
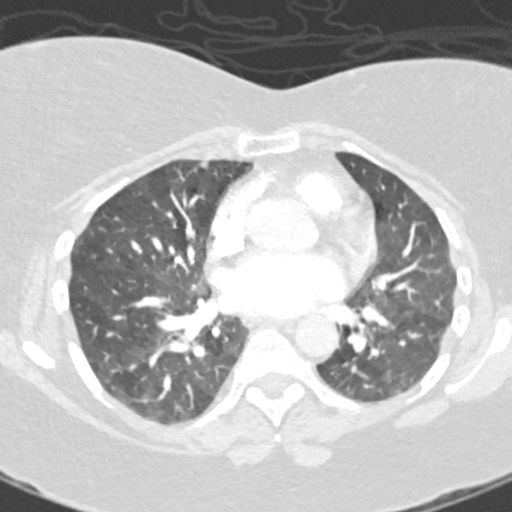
[im 110/231  soft-tissue]
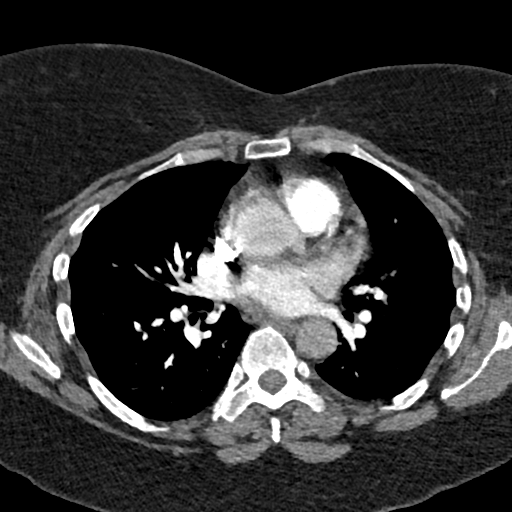
[im 121/231  lung]
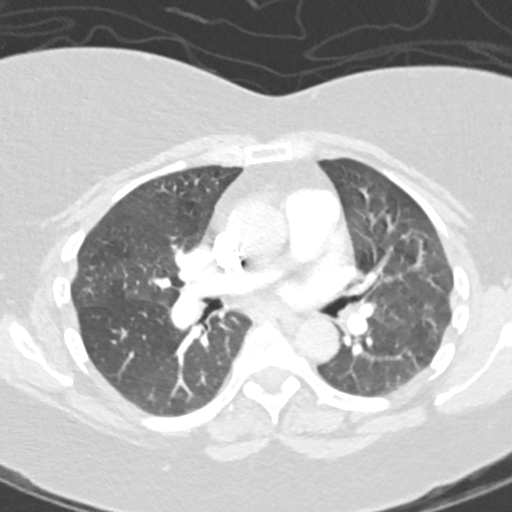
[im 132/231  soft-tissue]
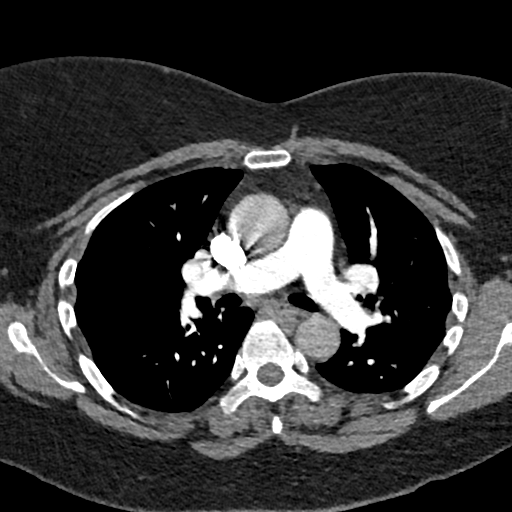
[im 154/231  lung]
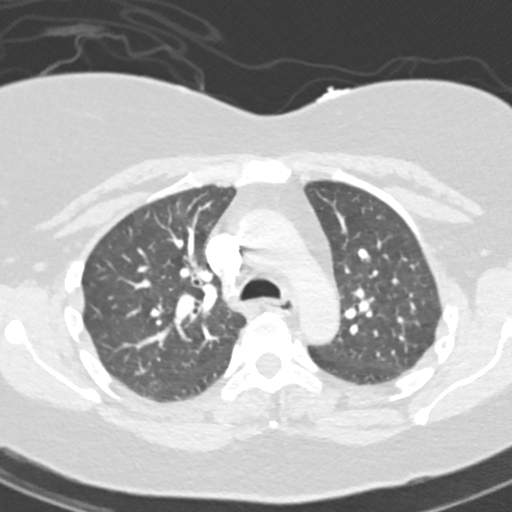
[im 165/231  soft-tissue]
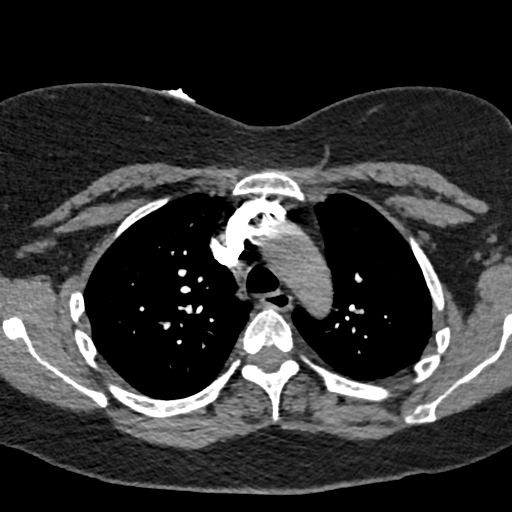
[im 176/231  lung]
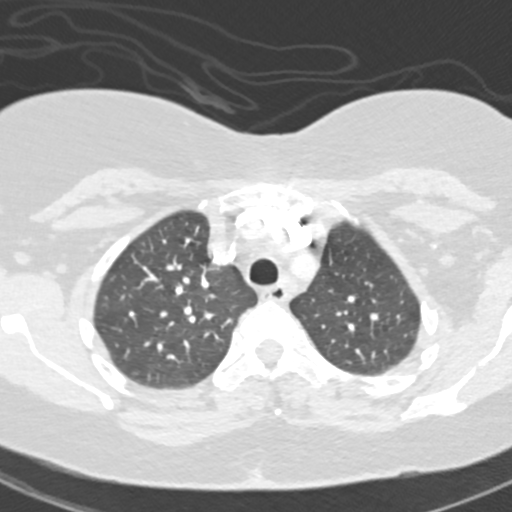
[im 187/231  soft-tissue]
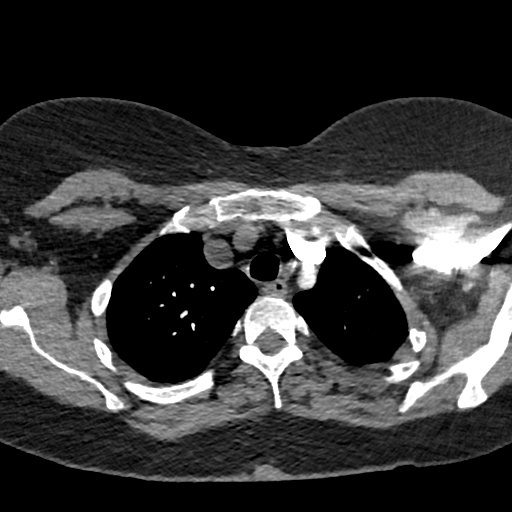
[im 209/231  lung]
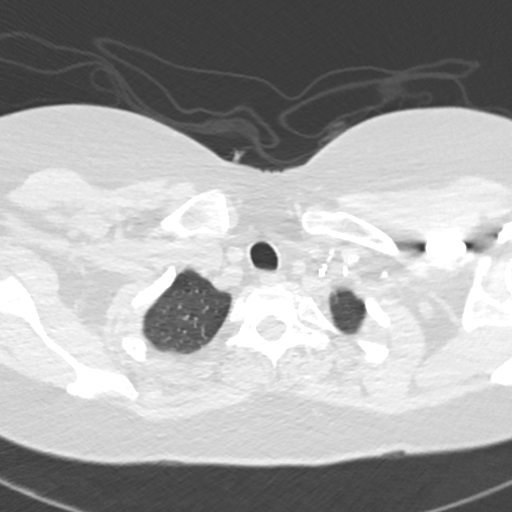
[im 220/231  soft-tissue]
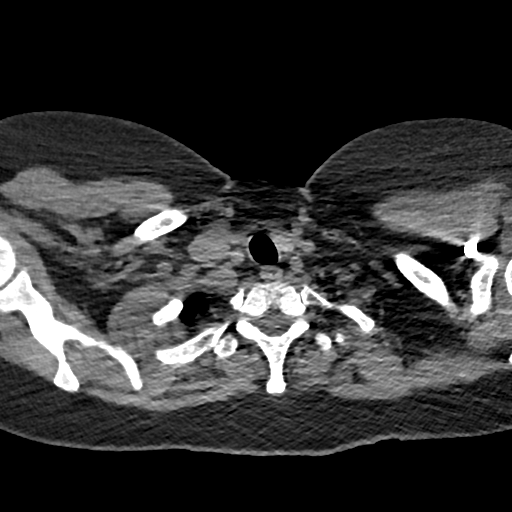

[Series 8: coronal mpr · coronal · 0.59mm/px · 3 of 91 slices shown]
[im 23/91  soft-tissue]
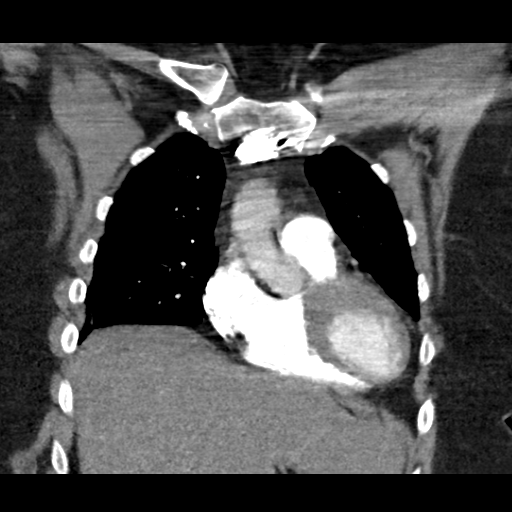
[im 46/91  soft-tissue]
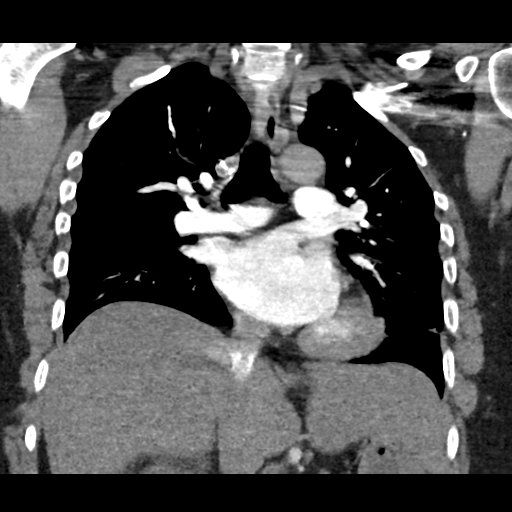
[im 68/91  soft-tissue]
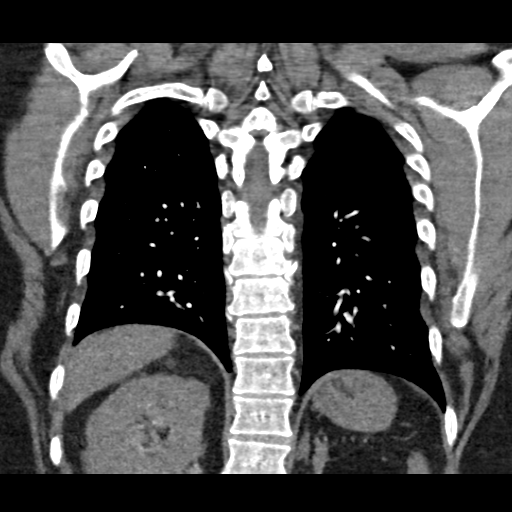

[19 of 46 positions shown; findings below may reference images not displayed]

FINDINGS: No evidence of pulmonary embolism.

Lungs are essentially clear. Mild subpleural reticulation/ scarring
in the right middle lobe and lingula. Mild mosaic attenuation. No
pleural effusion or pneumothorax.

Visualized thyroid is unremarkable.

The heart is normal size.  No pericardial effusion.

No suspicious mediastinal, hilar, or axillary lymphadenopathy.

Visualized upper abdomen node grossly unremarkable.

Visualized osseous structures are within normal limits.

Review of the MIP images confirms the above findings.
IMPRESSION: No evidence of pulmonary embolism.

No evidence of acute cardiopulmonary disease.

## 2017-08-13 ENCOUNTER — Encounter: Payer: Self-pay | Admitting: Family

## 2017-10-22 ENCOUNTER — Ambulatory Visit (INDEPENDENT_AMBULATORY_CARE_PROVIDER_SITE_OTHER): Payer: Managed Care, Other (non HMO) | Admitting: Family

## 2017-10-22 ENCOUNTER — Encounter: Payer: Self-pay | Admitting: Family

## 2017-10-22 VITALS — BP 123/78 | HR 72 | Temp 97.4°F | Ht 63.0 in | Wt 188.6 lb

## 2017-10-22 DIAGNOSIS — I480 Paroxysmal atrial fibrillation: Secondary | ICD-10-CM

## 2017-10-22 DIAGNOSIS — F331 Major depressive disorder, recurrent, moderate: Secondary | ICD-10-CM

## 2017-10-22 DIAGNOSIS — Z905 Acquired absence of kidney: Secondary | ICD-10-CM | POA: Insufficient documentation

## 2017-10-22 DIAGNOSIS — E785 Hyperlipidemia, unspecified: Secondary | ICD-10-CM

## 2017-10-22 DIAGNOSIS — I10 Essential (primary) hypertension: Secondary | ICD-10-CM

## 2017-10-22 DIAGNOSIS — F32A Depression, unspecified: Secondary | ICD-10-CM | POA: Insufficient documentation

## 2017-10-22 DIAGNOSIS — F419 Anxiety disorder, unspecified: Secondary | ICD-10-CM

## 2017-10-22 DIAGNOSIS — I4891 Unspecified atrial fibrillation: Secondary | ICD-10-CM

## 2017-10-22 DIAGNOSIS — E669 Obesity, unspecified: Secondary | ICD-10-CM | POA: Insufficient documentation

## 2017-10-22 DIAGNOSIS — J4520 Mild intermittent asthma, uncomplicated: Secondary | ICD-10-CM | POA: Insufficient documentation

## 2017-10-22 DIAGNOSIS — Z1159 Encounter for screening for other viral diseases: Secondary | ICD-10-CM

## 2017-10-22 DIAGNOSIS — K219 Gastro-esophageal reflux disease without esophagitis: Secondary | ICD-10-CM | POA: Insufficient documentation

## 2017-10-22 DIAGNOSIS — Z114 Encounter for screening for human immunodeficiency virus [HIV]: Secondary | ICD-10-CM

## 2017-10-22 DIAGNOSIS — Z0000 Encounter for general adult medical examination without abnormal findings: Secondary | ICD-10-CM

## 2017-10-22 DIAGNOSIS — F329 Major depressive disorder, single episode, unspecified: Secondary | ICD-10-CM | POA: Insufficient documentation

## 2017-10-22 DIAGNOSIS — E782 Mixed hyperlipidemia: Secondary | ICD-10-CM | POA: Insufficient documentation

## 2017-10-22 MED ORDER — ALBUTEROL SULFATE HFA 108 (90 BASE) MCG/ACT IN AERS: 2.0000 | INHALATION_SPRAY | Freq: Four times a day (QID) | RESPIRATORY_TRACT | 1 refills | Status: DC | PRN

## 2017-10-22 MED ORDER — VENLAFAXINE HCL ER 150 MG PO CP24: 150.0000 mg | ORAL_CAPSULE | Freq: Every day | ORAL | 1 refills | Status: DC

## 2017-10-22 MED ORDER — SIMVASTATIN 40 MG PO TABS: 40.0000 mg | ORAL_TABLET | Freq: Every evening | ORAL | 1 refills | Status: DC

## 2017-10-22 MED ORDER — METOPROLOL TARTRATE 50 MG PO TABS: 50.0000 mg | ORAL_TABLET | Freq: Two times a day (BID) | ORAL | 1 refills | Status: DC

## 2017-10-22 MED ORDER — APIXABAN 5 MG PO TABS: 5.0000 mg | ORAL_TABLET | Freq: Two times a day (BID) | ORAL | 0 refills | Status: DC

## 2017-10-22 MED ORDER — APIXABAN 5 MG PO TABS: 5.0000 mg | ORAL_TABLET | Freq: Two times a day (BID) | ORAL | 5 refills | Status: DC

## 2017-10-22 MED ORDER — CLONAZEPAM 1 MG PO TABS: 1.0000 mg | ORAL_TABLET | Freq: Two times a day (BID) | ORAL | 5 refills | Status: DC

## 2017-10-22 MED ORDER — PANTOPRAZOLE SODIUM 40 MG PO TBEC: 40.0000 mg | DELAYED_RELEASE_TABLET | Freq: Two times a day (BID) | ORAL | 1 refills | Status: DC

## 2017-10-22 MED ORDER — MONTELUKAST SODIUM 10 MG PO TABS: 10.0000 mg | ORAL_TABLET | Freq: Every day | ORAL | 1 refills | Status: DC

## 2017-10-22 NOTE — Patient Instructions (Signed)

## 2017-10-22 NOTE — Progress Notes (Signed)
Subjective:    Patient ID: Natalie Cobb, female    DOB: March 31, 1961, 57 y.o.   MRN: [id number]  PT presents to the office today to establish care and CPE without pap. Pt has recently moved back to this area from [place], [place].  Hypertension  This is a chronic problem. The current episode started more than 1 year ago. The problem has been resolved since onset. The problem is controlled. Associated symptoms include anxiety. Pertinent negatives include no malaise/fatigue, peripheral edema or shortness of breath. Risk factors for coronary artery disease include dyslipidemia, obesity and sedentary lifestyle. The current treatment provides moderate improvement. Hypertensive end-organ damage includes CAD/MI.  Anxiety  Presents for follow-up visit. Symptoms include depressed mood, excessive worry, irritability, nervous/anxious behavior and restlessness. Patient reports no shortness of breath. Symptoms occur occasionally. The severity of symptoms is moderate. The quality of sleep is good.   Her past medical history is significant for asthma.  Depression         This is a chronic problem.  The current episode started more than 1 year ago.   The onset quality is gradual.   The problem occurs intermittently.  The problem has been waxing and waning since onset.  Associated symptoms include irritable, restlessness and sad.  Associated symptoms include no helplessness and no hopelessness.  Past treatments include SNRIs - Serotonin and norepinephrine reuptake inhibitors.  Compliance with treatment is good.  Past medical history includes anxiety.   Gastroesophageal Reflux  She complains of heartburn. She reports no belching, no coughing or no wheezing. The current episode started more than 1 year ago. The problem occurs occasionally. The problem has been waxing and waning. Risk factors include obesity. She has tried a PPI for the symptoms.  Asthma  She complains of frequent throat clearing. There is no cough,  shortness of breath or wheezing. This is a chronic problem. The current episode started more than 1 year ago. The problem occurs intermittently. The problem has been waxing and waning. Associated symptoms include heartburn. Pertinent negatives include no malaise/fatigue. Her past medical history is significant for asthma.  Hyperlipidemia  This is a chronic problem. The current episode started more than 1 year ago. The problem is uncontrolled. Recent lipid tests were reviewed and are high. Exacerbating diseases include obesity. Pertinent negatives include no shortness of breath. Current antihyperlipidemic treatment includes statins. The current treatment provides mild improvement of lipids.       Review of Systems  Constitutional: Positive for irritability. Negative for malaise/fatigue.  Respiratory: Negative for cough, shortness of breath and wheezing.   Gastrointestinal: Positive for heartburn.  Psychiatric/Behavioral: Positive for depression. The patient is nervous/anxious.   All other systems reviewed and are negative.    Breast Cancer-relatedfamily history is not on file.  Social History   Socioeconomic History  . Marital status: Legally Separated    Spouse name: None  . Number of children: None  . Years of education: None  . Highest education level: None  Social Needs  . Financial resource strain: None  . Food insecurity - worry: None  . Food insecurity - inability: None  . Transportation needs - medical: None  . Transportation needs - non-medical: None  Occupational History  . None  Tobacco Use  . Smoking status: Former Smoker    Years: 10.00  . Smokeless tobacco: Never Used  Substance and Sexual Activity  . Alcohol use: Yes    Comment: rarely  . Drug use: Yes    Types:  Marijuana  . Sexual activity: Not Currently    Birth control/protection: Surgical    Comment: Quit around 2010  Other Topics Concern  . None  Social History Narrative  . None       Objective:    Physical Exam  Constitutional: She is oriented to person, place, and time. She appears well-developed and well-nourished. She is irritable. No distress.  HENT:  Head: Normocephalic and atraumatic.  Right Ear: External ear normal.  Left Ear: External ear normal.  Nose: Nose normal.  Mouth/Throat: Oropharynx is clear and moist.  Eyes: Pupils are equal, round, and reactive to light.  Neck: Normal range of motion. Neck supple. No thyromegaly present.  Cardiovascular: Normal rate, regular rhythm, normal heart sounds and intact distal pulses.  No murmur heard. Pulmonary/Chest: Effort normal and breath sounds normal. No respiratory distress. She has no wheezes.  Abdominal: Soft. Bowel sounds are normal. She exhibits no distension. There is no tenderness.  Musculoskeletal: Normal range of motion. She exhibits no edema or tenderness.  Neurological: She is alert and oriented to person, place, and time.  Skin: Skin is warm and dry.  Psychiatric: She has a normal mood and affect. Her behavior is normal. Judgment and thought content normal.  Vitals reviewed.     BP 123/78   Pulse 72   Temp (!) 97.4 F (36.3 C) (Oral)   Ht '5\' 3"'$  (1.6 m)   Wt 188 lb 9.6 oz (85.5 kg)   BMI 33.41 kg/m      Assessment & Plan:  1. Essential hypertension - CMP14+EGFR - metoprolol tartrate (LOPRESSOR) 50 MG tablet; Take 1 tablet (50 mg total) by mouth 2 (two) times daily.  Dispense: 180 tablet; Refill: 1  2. PAF (paroxysmal atrial fibrillation) ([place]) - CMP14+EGFR - Ambulatory referral to Cardiology - apixaban (ELIQUIS) 5 MG TABS tablet; Take 1 tablet (5 mg total) by mouth 2 (two) times daily.  Dispense: 60 tablet; Refill: 0 - metoprolol tartrate (LOPRESSOR) 50 MG tablet; Take 1 tablet (50 mg total) by mouth 2 (two) times daily.  Dispense: 180 tablet; Refill: 1  3. Anxiety  - CMP14+EGFR - clonazePAM (KLONOPIN) 1 MG tablet; Take 1 tablet (1 mg total) by mouth 2 (two) times daily.  Dispense: 30 tablet;  Refill: 5 - venlafaxine XR (EFFEXOR-XR) 150 MG 24 hr capsule; Take 1 capsule (150 mg total) by mouth daily with breakfast.  Dispense: 90 capsule; Refill: 1  4. Hyperlipidemia, unspecified hyperlipidemia type - CMP14+EGFR - Lipid panel - simvastatin (ZOCOR) 40 MG tablet; Take 1 tablet (40 mg total) by mouth every evening.  Dispense: 90 tablet; Refill: 1  5. Gastroesophageal reflux disease without esophagitis - CMP14+EGFR - pantoprazole (PROTONIX) 40 MG tablet; Take 1 tablet (40 mg total) by mouth 2 (two) times daily.  Dispense: 90 tablet; Refill: 1  6. Mild intermittent asthma without complication - LKT62+BWLS - albuterol (PROAIR HFA) 108 (90 Base) MCG/ACT inhaler; Inhale 2 puffs into the lungs every 6 (six) hours as needed for wheezing or shortness of breath.  Dispense: 6.7 g; Refill: 1 - montelukast (SINGULAIR) 10 MG tablet; Take 1 tablet (10 mg total) by mouth at bedtime.  Dispense: 90 tablet; Refill: 1 - venlafaxine XR (EFFEXOR-XR) 150 MG 24 hr capsule; Take 1 capsule (150 mg total) by mouth daily with breakfast.  Dispense: 90 capsule; Refill: 1  7. Moderate episode of recurrent major depressive disorder (HCC)  - CMP14+EGFR - venlafaxine XR (EFFEXOR-XR) 150 MG 24 hr capsule; Take 1 capsule (150 mg total) by  mouth daily with breakfast.  Dispense: 90 capsule; Refill: 1  8. Rapid atrial fibrillation (HCC) - CMP14+EGFR  9. H/O kidney removal  - CMP14+EGFR  10. Obesity (BMI 30-39.9)  - CMP14+EGFR  11. Need for hepatitis C screening test - CMP14+EGFR - Hepatitis C antibody  12. Encounter for screening for HIV  - CMP14+EGFR - HIV antibody   Continue all meds Labs pending Health Maintenance reviewed- PT to schedule mammogram and fax colonoscopy to our office so we can scan into EPIC  Diet and exercise encouraged Brooke, FNP

## 2017-10-23 LAB — CMP14+EGFR
ALBUMIN: 4.1 g/dL (ref 3.5–5.5)
ALT: 17 IU/L (ref 0–32)
AST: 20 IU/L (ref 0–40)
Albumin/Globulin Ratio: 1.3 (ref 1.2–2.2)
Alkaline Phosphatase: 84 IU/L (ref 39–117)
BUN / CREAT RATIO: 26 — AB (ref 9–23)
BUN: 21 mg/dL (ref 6–24)
Bilirubin Total: 0.5 mg/dL (ref 0.0–1.2)
CO2: 20 mmol/L (ref 20–29)
CREATININE: 0.82 mg/dL (ref 0.57–1.00)
Calcium: 9.9 mg/dL (ref 8.7–10.2)
Chloride: 104 mmol/L (ref 96–106)
GFR calc Af Amer: 93 mL/min/{1.73_m2} (ref >59)
GFR calc non Af Amer: 80 mL/min/{1.73_m2} (ref >59)
GLOBULIN, TOTAL: 3.1 g/dL (ref 1.5–4.5)
Glucose: 98 mg/dL (ref 65–99)
Potassium: 5 mmol/L (ref 3.5–5.2)
SODIUM: 142 mmol/L (ref 134–144)
Total Protein: 7.2 g/dL (ref 6.0–8.5)

## 2017-10-23 LAB — ANEMIA PROFILE B
Basophils Absolute: 0 10*3/uL (ref 0.0–0.2)
Basos: 0 %
EOS (ABSOLUTE): 0.3 10*3/uL (ref 0.0–0.4)
Eos: 3 %
FOLATE: 6.8 ng/mL (ref >3.0)
Ferritin: 98 ng/mL (ref 15–150)
Hematocrit: 40.7 % (ref 34.0–46.6)
Hemoglobin: 13.2 g/dL (ref 11.1–15.9)
IMMATURE GRANS (ABS): 0 10*3/uL (ref 0.0–0.1)
IRON SATURATION: 14 % — AB (ref 15–55)
Immature Granulocytes: 0 %
Iron: 55 ug/dL (ref 27–159)
LYMPHS: 44 %
Lymphocytes Absolute: 4.7 10*3/uL — ABNORMAL HIGH (ref 0.7–3.1)
MCH: 31.4 pg (ref 26.6–33.0)
MCHC: 32.4 g/dL (ref 31.5–35.7)
MCV: 97 fL (ref 79–97)
MONOS ABS: 0.9 10*3/uL (ref 0.1–0.9)
Monocytes: 9 %
NEUTROS PCT: 44 %
Neutrophils Absolute: 4.7 10*3/uL (ref 1.4–7.0)
Platelets: 505 10*3/uL — ABNORMAL HIGH (ref 150–379)
RBC: 4.2 x10E6/uL (ref 3.77–5.28)
RDW: 14 % (ref 12.3–15.4)
Retic Ct Pct: 2.9 % — ABNORMAL HIGH (ref 0.6–2.6)
Total Iron Binding Capacity: 383 ug/dL (ref 250–450)
UIBC: 328 ug/dL (ref 131–425)
VITAMIN B 12: 518 pg/mL (ref 232–1245)
WBC: 10.6 10*3/uL (ref 3.4–10.8)

## 2017-10-23 LAB — LIPID PANEL
CHOL/HDL RATIO: 4.4 ratio (ref 0.0–4.4)
Cholesterol, Total: 196 mg/dL (ref 100–199)
HDL: 45 mg/dL (ref >39)
LDL CALC: 115 mg/dL — AB (ref 0–99)
Triglycerides: 181 mg/dL — ABNORMAL HIGH (ref 0–149)
VLDL Cholesterol Cal: 36 mg/dL (ref 5–40)

## 2017-10-23 LAB — HIV ANTIBODY (ROUTINE TESTING W REFLEX): HIV Screen 4th Generation wRfx: NONREACTIVE

## 2017-10-23 LAB — HEPATITIS C ANTIBODY

## 2017-10-29 ENCOUNTER — Other Ambulatory Visit: Payer: Self-pay | Admitting: Family

## 2017-10-29 DIAGNOSIS — D75838 Other thrombocytosis: Secondary | ICD-10-CM

## 2017-10-29 DIAGNOSIS — R7989 Other specified abnormal findings of blood chemistry: Principal | ICD-10-CM

## 2017-10-29 DIAGNOSIS — Z9081 Acquired absence of spleen: Secondary | ICD-10-CM

## 2017-10-30 ENCOUNTER — Telehealth: Payer: Self-pay | Admitting: Family

## 2017-10-30 NOTE — Telephone Encounter (Signed)
Patient aware of Christy's reply and our recommendation for Hematology referral.  She agrees to see someone.  Referral ordered.

## 2017-11-01 ENCOUNTER — Telehealth: Payer: Self-pay

## 2017-11-01 NOTE — Telephone Encounter (Signed)
INsurance denied prior auth for Pantoprazole DR 40 mg BID

## 2017-11-01 NOTE — Telephone Encounter (Signed)
Pt has been on this medication. Is patient's GERD controlled? If so we can decrease to daily.

## 2017-12-03 ENCOUNTER — Other Ambulatory Visit: Payer: Self-pay

## 2017-12-03 ENCOUNTER — Encounter (HOSPITAL_COMMUNITY): Payer: Self-pay | Admitting: Hematology

## 2017-12-03 ENCOUNTER — Inpatient Hospital Stay (HOSPITAL_COMMUNITY): Payer: Managed Care, Other (non HMO) | Attending: Hematology | Admitting: Hematology

## 2017-12-03 ENCOUNTER — Inpatient Hospital Stay (HOSPITAL_COMMUNITY): Payer: Managed Care, Other (non HMO)

## 2017-12-03 VITALS — BP 132/67 | HR 73 | Temp 98.0°F | Resp 18 | Ht 64.0 in | Wt 186.6 lb

## 2017-12-03 DIAGNOSIS — D473 Essential (hemorrhagic) thrombocythemia: Secondary | ICD-10-CM | POA: Insufficient documentation

## 2017-12-03 DIAGNOSIS — D75839 Thrombocytosis, unspecified: Secondary | ICD-10-CM

## 2017-12-03 DIAGNOSIS — R7989 Other specified abnormal findings of blood chemistry: Secondary | ICD-10-CM | POA: Diagnosis not present

## 2017-12-03 DIAGNOSIS — Z87891 Personal history of nicotine dependence: Secondary | ICD-10-CM | POA: Diagnosis not present

## 2017-12-03 DIAGNOSIS — Z9081 Acquired absence of spleen: Secondary | ICD-10-CM | POA: Insufficient documentation

## 2017-12-03 DIAGNOSIS — I10 Essential (primary) hypertension: Secondary | ICD-10-CM

## 2017-12-03 DIAGNOSIS — D75838 Other thrombocytosis: Secondary | ICD-10-CM | POA: Insufficient documentation

## 2017-12-03 NOTE — Progress Notes (Signed)
[name] NOTE  Patient Care Team: Sharion Balloon, FNP as PCP - General (Family Medicine) Patient, No Pcp Per (General Practice)  CHIEF COMPLAINTS/PURPOSE OF CONSULTATION:  Thrombocytosis.  HISTORY OF PRESENTING ILLNESS:  Natalie Cobb 57 y.o. female is seen in consultation today for further work-up and management of thrombocytosis.  Her most recent platelet count was in the range of 500,000.  She denies any recent infections or hospitalizations.  Denies any history of connective tissue disorders.  She complains of slight worsening of joint pains in the left side of the body.  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  Denies any fevers, night sweats or weight loss in the last 6 months.  She has occasional headaches and occasional nausea.  Appetite has been good.  No erythromelalgia or other vasomotor symptoms.  No echogenic pruritus.  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  No family history of polycythemia or other myeloproliferative disorders.  Paternal grandmother had brain tumor.  MEDICAL HISTORY:  Past Medical History:  Diagnosis Date  . Anxiety    anxiety attacks  . Asthma   . Cancer [facility])    Wilms tumor 59months  . Complication of anesthesia    did not get to sleep brfore Colonscopy  . Depression   . Diverticulitis   . GERD (gastroesophageal reflux disease)   . Hernia of abdominal wall   . Hypertension   . IBS (irritable bowel syndrome)     SURGICAL HISTORY: Past Surgical History:  Procedure Laterality Date  . LYMPH GLAND EXCISION     neck in 20's  . nephectomy    . ORIF ANKLE FRACTURE Right 10/18/2013   Procedure: OPEN REDUCTION INTERNAL FIXATION (ORIF) RIGHT TRIMALLEOLAR ANKLE FRACTURE;  Surgeon: Newt Minion, MD;  Location: [facility];  Service: Orthopedics;  Laterality: Right;  . REVISION OF SCAR     of abdominal scar age 38  . spleenectomy    . TUBAL LIGATION    . WISDOM TOOTH  EXTRACTION      SOCIAL HISTORY: Social History   Socioeconomic History  . Marital status: Legally Separated    Spouse name: Not on file  . Number of children: Not on file  . Years of education: Not on file  . Highest education level: Not on file  Occupational History  . Not on file  Social Needs  . Financial resource strain: Not on file  . Food insecurity:    Worry: Not on file    Inability: Not on file  . Transportation needs:    Medical: Not on file    Non-medical: Not on file  Tobacco Use  . Smoking status: Former Smoker    Years: 10.00  . Smokeless tobacco: Never Used  Substance and Sexual Activity  . Alcohol use: Yes    Comment: rarely  . Drug use: Yes    Types: Marijuana  . Sexual activity: Not Currently    Birth control/protection: Surgical    Comment: Quit around 2010  Lifestyle  . Physical activity:    Days per week: Not on file    Minutes per session: Not on file  . Stress: Not on file  Relationships  . Social connections:    Talks on phone: Not on file    Gets together: Not on file    Attends religious service: Not on file    Active member of club or organization: Not on file  Attends meetings of clubs or organizations: Not on file    Relationship status: Not on file  . Intimate partner violence:    Fear of current or ex partner: Not on file    Emotionally abused: Not on file    Physically abused: Not on file    Forced sexual activity: Not on file  Other Topics Concern  . Not on file  Social History Narrative  . Not on file    FAMILY HISTORY: Family History  Problem Relation Age of Onset  . Stroke Mother   . Pulmonary fibrosis Father   . Brain cancer Paternal Grandmother     ALLERGIES:  is allergic to compazine [prochlorperazine edisylate]; meclizine; thorazine [chlorpromazine]; and sulfa antibiotics.  MEDICATIONS:  Current Outpatient Medications  Medication Sig Dispense Refill  . albuterol (PROAIR HFA) 108 (90 Base) MCG/ACT inhaler  Inhale 2 puffs into the lungs every 6 (six) hours as needed for wheezing or shortness of breath. 6.7 g 1  . apixaban (ELIQUIS) 5 MG TABS tablet Take 1 tablet (5 mg total) by mouth 2 (two) times daily. 180 tablet 5  . Cholecalciferol (VITAMIN D-3) 5000 UNITS TABS Take 5,000 Units by mouth every evening.     . clonazePAM (KLONOPIN) 1 MG tablet Take 1 tablet (1 mg total) by mouth 2 (two) times daily. 30 tablet 5  . fish oil-omega-3 fatty acids 1000 MG capsule Take 1 g by mouth every evening.     . metoprolol tartrate (LOPRESSOR) 50 MG tablet Take 1 tablet (50 mg total) by mouth 2 (two) times daily. 180 tablet 1  . montelukast (SINGULAIR) 10 MG tablet Take 1 tablet (10 mg total) by mouth at bedtime. 90 tablet 1  . pantoprazole (PROTONIX) 40 MG tablet Take 1 tablet (40 mg total) by mouth 2 (two) times daily. 90 tablet 1  . simvastatin (ZOCOR) 40 MG tablet Take 1 tablet (40 mg total) by mouth every evening. 90 tablet 1  . venlafaxine XR (EFFEXOR-XR) 150 MG 24 hr capsule Take 1 capsule (150 mg total) by mouth daily with breakfast. 90 capsule 1   No current facility-administered medications for this visit.     REVIEW OF SYSTEMS:   Constitutional: Denies fevers, chills or abnormal night sweats Eyes: Denies blurriness of vision, double vision or watery eyes.  Headaches present. Ears, nose, mouth, throat, and face: Denies mucositis or sore throat Respiratory: Denies cough, dyspnea or wheezes Cardiovascular: Denies palpitation, chest discomfort or lower extremity swelling Gastrointestinal:  Denies  heartburn or change in bowel habits.  Occasional nausea. Skin: Denies abnormal skin rashes Lymphatics: Denies new lymphadenopathy or easy bruising Neurological:Denies numbness, tingling or new weaknesses Behavioral/Psych: Mood is stable, no new changes  All other systems were reviewed with the patient and are negative.  PHYSICAL EXAMINATION: ECOG PERFORMANCE STATUS: 0 - Asymptomatic  Vitals:   12/03/17  1334  BP: 132/67  Pulse: 73  Resp: 18  Temp: 98 F (36.7 C)  SpO2: 96%   Filed Weights   12/03/17 1334  Weight: 186 lb 9.6 oz (84.6 kg)    GENERAL:alert, no distress and comfortable SKIN: skin color, texture, turgor are normal, no rashes or significant lesions EYES: normal, conjunctiva are pink and non-injected, sclera clear OROPHARYNX:no exudate, no erythema and lips, buccal mucosa, and tongue normal  NECK: supple, thyroid normal size, non-tender, without nodularity LYMPH:  no palpable lymphadenopathy in the cervical, axillary or inguinal LUNGS: clear to auscultation and percussion with normal breathing effort HEART: regular rate & rhythm and  no murmurs and no lower extremity edema ABDOMEN:abdomen soft, non-tender and normal bowel sounds Musculoskeletal:no cyanosis of digits and no clubbing  PSYCH: alert & oriented x 3 with fluent speech   LABORATORY DATA:  I have reviewed the data as listed Lab Results  Component Value Date   WBC 10.6 10/22/2017   HGB 13.2 10/22/2017   HCT 40.7 10/22/2017   MCV 97 10/22/2017   PLT 505 (H) 10/22/2017     Chemistry      Component Value Date/Time   NA 142 10/22/2017 1014   K 5.0 10/22/2017 1014   CL 104 10/22/2017 1014   CO2 20 10/22/2017 1014   BUN 21 10/22/2017 1014   CREATININE 0.82 10/22/2017 1014      Component Value Date/Time   CALCIUM 9.9 10/22/2017 1014   ALKPHOS 84 10/22/2017 1014   AST 20 10/22/2017 1014   ALT 17 10/22/2017 1014   BILITOT 0.5 10/22/2017 1014       RADIOGRAPHIC STUDIES: I have personally reviewed the radiological images as listed and agreed with the findings in the report. No results found.  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  No B symptoms noted. - We will order BCR/ABL by FISH, Jak 2 V617F testing today along with a repeat CBC and LDH levels.  This is to rule out underlying myeloproliferative disorders.  If they come back negative, she could then follow-up with her primary care doctor.  She also had mild leukocytosis since 2015.  I will see her back in 2 to 3 weeks to discuss the results.  Orders Placed This Encounter  Procedures  . JAK2 V617F, w Reflex to CALR/E12/MPL  . CBC with Differential    Standing Status:   Future    Standing Expiration Date:   12/03/2018  . Lactate dehydrogenase    Standing Status:   Future    Standing Expiration Date:   12/03/2018  . C-reactive protein    Standing Status:   Future    Standing Expiration Date:   12/03/2018    All questions were answered. The patient knows to call the clinic with any problems, questions or concerns.     Derek Jack, MD 12/03/2017 2:32 PM

## 2017-12-03 NOTE — Assessment & Plan Note (Addendum)
1.  Thrombocytosis: - Splenectomy and left nephrectomy at 6 months of age (1963) due to left kidney Wilms tumor, status post chemo and radiation therapy at [facility] - Right neck lymph node biopsy in 1992, no evidence of malignancy at [place] - No vasomotor symptoms or aquagenic pruritus suggestive of myeloproliferative disorders.  No B symptoms noted. - We will order BCR/ABL by FISH, Jak 2 V617F testing today along with a repeat CBC and LDH levels.  This is to rule out underlying myeloproliferative disorders.  If they come back negative, she could then follow-up with her primary care doctor.  She also had mild leukocytosis since 2015.  I will see her back in 2 to 3 weeks to discuss the results.

## 2017-12-07 ENCOUNTER — Telehealth: Payer: Self-pay | Admitting: Family

## 2017-12-07 ENCOUNTER — Other Ambulatory Visit: Payer: Self-pay | Admitting: *Deleted

## 2017-12-07 DIAGNOSIS — K219 Gastro-esophageal reflux disease without esophagitis: Secondary | ICD-10-CM

## 2017-12-07 MED ORDER — PANTOPRAZOLE SODIUM 40 MG PO TBEC: 40.0000 mg | DELAYED_RELEASE_TABLET | Freq: Every day | ORAL | 1 refills | Status: DC

## 2017-12-07 NOTE — Progress Notes (Signed)
New RX sent to [facility] Okayed per [facility]

## 2017-12-11 ENCOUNTER — Encounter: Payer: Self-pay | Admitting: Cardiology

## 2017-12-11 ENCOUNTER — Encounter (HOSPITAL_COMMUNITY): Payer: Self-pay | Admitting: Hematology

## 2017-12-11 LAB — CALR + JAK2 E12-15 + MPL (REFLEXED)

## 2017-12-11 LAB — JAK2 V617F, W REFLEX TO CALR/E12/MPL

## 2017-12-11 NOTE — Progress Notes (Deleted)
Cardiology Office Note  Date: 12/11/2017   ID: [place], Abrams 09/21/1960, MRN [id number]  PCP: Sharion Balloon, FNP  Consulting cardiologist: Rozann Lesches, MD   No chief complaint on file.   History of Present Illness: Natalie Cobb is a 57 y.o. female referred for cardiology consultation by Ms. Hawks NP for evaluation of reported paroxysmal atrial fibrillation.  Records indicate a diagnosis of atrial fibrillation back in 2015, started on Eliquis and metoprolol at that time.  CHADSVASC score is 2.  Past Medical History:  Diagnosis Date  . Anxiety   . Asthma   . Depression   . Diverticulitis   . GERD (gastroesophageal reflux disease)   . Hernia of abdominal wall   . Hypertension   . IBS (irritable bowel syndrome)   . PAF (paroxysmal atrial fibrillation) ([place])   . Thrombocytosis ([place])    Splenectomy 1963  . Wilm's tumor of left kidney ([place])    Nephrectomy 1963, chemotherapy and radiation [facility]    Past Surgical History:  Procedure Laterality Date  . Left nephrectomy  1963  . LYMPH GLAND EXCISION     neck in 20's  . ORIF ANKLE FRACTURE Right 10/18/2013   Procedure: OPEN REDUCTION INTERNAL FIXATION (ORIF) RIGHT TRIMALLEOLAR ANKLE FRACTURE;  Surgeon: Newt Minion, MD;  Location: [facility];  Service: Orthopedics;  Laterality: Right;  . REVISION OF SCAR     of abdominal scar age 62  . Splenectomy  1963  . TUBAL LIGATION    . WISDOM TOOTH EXTRACTION      Current Outpatient Medications  Medication Sig Dispense Refill  . albuterol (PROAIR HFA) 108 (90 Base) MCG/ACT inhaler Inhale 2 puffs into the lungs every 6 (six) hours as needed for wheezing or shortness of breath. 6.7 g 1  . apixaban (ELIQUIS) 5 MG TABS tablet Take 1 tablet (5 mg total) by mouth 2 (two) times daily. 180 tablet 5  . Cholecalciferol (VITAMIN D-3) 5000 UNITS TABS Take 5,000 Units by mouth every evening.     . clonazePAM (KLONOPIN) 1 MG tablet Take 1 tablet (1 mg total) by  mouth 2 (two) times daily. 30 tablet 5  . fish oil-omega-3 fatty acids 1000 MG capsule Take 1 g by mouth every evening.     . metoprolol tartrate (LOPRESSOR) 50 MG tablet Take 1 tablet (50 mg total) by mouth 2 (two) times daily. 180 tablet 1  . montelukast (SINGULAIR) 10 MG tablet Take 1 tablet (10 mg total) by mouth at bedtime. 90 tablet 1  . pantoprazole (PROTONIX) 40 MG tablet Take 1 tablet (40 mg total) by mouth daily. 90 tablet 1  . simvastatin (ZOCOR) 40 MG tablet Take 1 tablet (40 mg total) by mouth every evening. 90 tablet 1  . venlafaxine XR (EFFEXOR-XR) 150 MG 24 hr capsule Take 1 capsule (150 mg total) by mouth daily with breakfast. 90 capsule 1   No current facility-administered medications for this visit.    Allergies:  Compazine [prochlorperazine edisylate]; Meclizine; Thorazine [chlorpromazine]; and Sulfa antibiotics   Social History: The patient  reports that she has quit smoking. She quit after 10.00 years of use. She has never used smokeless tobacco. She reports that she drinks alcohol. She reports that she has current or past drug history. Drug: Marijuana.   Family History: The patient's family history includes Brain cancer in her paternal grandmother; Pulmonary fibrosis in her father; Stroke in her mother.   ROS:  Please see the history of  present illness. Otherwise, complete review of systems is positive for {NONE DEFAULTED:18576::"none"}.  All other systems are reviewed and negative.   Physical Exam: VS:  There were no vitals taken for this visit., BMI There is no height or weight on file to calculate BMI.  Wt Readings from Last 3 Encounters:  12/03/17 186 lb 9.6 oz (84.6 kg)  10/22/17 188 lb 9.6 oz (85.5 kg)  11/05/13 177 lb 14.6 oz (80.7 kg)    General: Patient appears comfortable at rest. HEENT: Conjunctiva and lids normal, oropharynx clear with moist mucosa. Neck: Supple, no elevated JVP or carotid bruits, no thyromegaly. Lungs: Clear to auscultation, nonlabored  breathing at rest. Cardiac: Regular rate and rhythm, no S3 or significant systolic murmur, no pericardial rub. Abdomen: Soft, nontender, no hepatomegaly, bowel sounds present, no guarding or rebound. Extremities: No pitting edema, distal pulses 2+. Skin: Warm and dry. Musculoskeletal: No kyphosis. Neuropsychiatric: Alert and oriented x3, affect grossly appropriate.  ECG: No recent tracing available for comparison.  Recent Labwork: 10/22/2017: ALT 17; AST 20; BUN 21; Creatinine, Ser 0.82; Hemoglobin 13.2; Platelets 505; Potassium 5.0; Sodium 142     Component Value Date/Time   CHOL 196 10/22/2017 1014   TRIG 181 (H) 10/22/2017 1014   HDL 45 10/22/2017 1014   CHOLHDL 4.4 10/22/2017 1014   LDLCALC 115 (H) 10/22/2017 1014    Other Studies Reviewed Today:  Echocardiogram 11/06/2013: Study Conclusions  Left ventricle: The cavity size was normal. Wall thickness was normal. Systolic function was normal. The estimated ejection fraction was in the range of 55% to 60%. Wall motion was normal; there were no regional wall motion abnormalities. Doppler parameters are consistent with abnormal left ventricular relaxation (grade 1 diastolic dysfunction).    Assessment and Plan:    Current medicines were reviewed with the patient today.  No orders of the defined types were placed in this encounter.   Disposition:  Signed, Satira Sark, MD, [facility] 12/11/2017 8:11 AM    [facility] [place], [place], [place] [address] Phone: [phone]; Fax: [phone]

## 2017-12-12 ENCOUNTER — Ambulatory Visit: Payer: Self-pay | Admitting: Cardiology

## 2017-12-13 LAB — TISSUE HYBRIDIZATION TO NCBH

## 2017-12-24 ENCOUNTER — Encounter: Payer: Self-pay | Admitting: *Deleted

## 2017-12-26 ENCOUNTER — Encounter (HOSPITAL_COMMUNITY): Payer: Self-pay | Admitting: Hematology

## 2017-12-26 ENCOUNTER — Inpatient Hospital Stay (HOSPITAL_COMMUNITY): Payer: Managed Care, Other (non HMO)

## 2017-12-26 ENCOUNTER — Other Ambulatory Visit: Payer: Self-pay

## 2017-12-26 ENCOUNTER — Inpatient Hospital Stay (HOSPITAL_COMMUNITY): Payer: Managed Care, Other (non HMO) | Attending: Hematology | Admitting: Hematology

## 2017-12-26 DIAGNOSIS — I4891 Unspecified atrial fibrillation: Secondary | ICD-10-CM | POA: Diagnosis not present

## 2017-12-26 DIAGNOSIS — D75838 Other thrombocytosis: Secondary | ICD-10-CM

## 2017-12-26 DIAGNOSIS — D473 Essential (hemorrhagic) thrombocythemia: Secondary | ICD-10-CM

## 2017-12-26 DIAGNOSIS — I10 Essential (primary) hypertension: Secondary | ICD-10-CM | POA: Diagnosis not present

## 2017-12-26 DIAGNOSIS — R7989 Other specified abnormal findings of blood chemistry: Secondary | ICD-10-CM

## 2017-12-26 DIAGNOSIS — Z85528 Personal history of other malignant neoplasm of kidney: Secondary | ICD-10-CM | POA: Diagnosis not present

## 2017-12-26 DIAGNOSIS — Z9081 Acquired absence of spleen: Secondary | ICD-10-CM | POA: Diagnosis not present

## 2017-12-26 DIAGNOSIS — Z7901 Long term (current) use of anticoagulants: Secondary | ICD-10-CM | POA: Diagnosis not present

## 2017-12-26 DIAGNOSIS — D75839 Thrombocytosis, unspecified: Secondary | ICD-10-CM

## 2017-12-26 LAB — CBC WITH DIFFERENTIAL/PLATELET
BASOS PCT: 1 %
Basophils Absolute: 0.1 10*3/uL (ref 0.0–0.1)
EOS ABS: 0.3 10*3/uL (ref 0.0–0.7)
Eosinophils Relative: 3 %
HCT: 41.2 % (ref 36.0–46.0)
HEMOGLOBIN: 13.3 g/dL (ref 12.0–15.0)
LYMPHS ABS: 4.2 10*3/uL — AB (ref 0.7–4.0)
Lymphocytes Relative: 42 %
MCH: 31.6 pg (ref 26.0–34.0)
MCHC: 32.3 g/dL (ref 30.0–36.0)
MCV: 97.9 fL (ref 78.0–100.0)
MONOS PCT: 9 %
Monocytes Absolute: 0.9 10*3/uL (ref 0.1–1.0)
NEUTROS ABS: 4.6 10*3/uL (ref 1.7–7.7)
NEUTROS PCT: 45 %
Platelets: 417 10*3/uL — ABNORMAL HIGH (ref 150–400)
RBC: 4.21 MIL/uL (ref 3.87–5.11)
RDW: 12.7 % (ref 11.5–15.5)
WBC: 10.1 10*3/uL (ref 4.0–10.5)

## 2017-12-26 LAB — C-REACTIVE PROTEIN: CRP: 1.1 mg/dL — AB (ref <1.0)

## 2017-12-26 LAB — LACTATE DEHYDROGENASE: LDH: 183 U/L (ref 98–192)

## 2017-12-26 NOTE — Assessment & Plan Note (Signed)
1.  Thrombocytosis: - Splenectomy and left nephrectomy at 59 months of age (1963) due to left kidney Wilms tumor, status post chemo and radiation therapy at [facility] - Right neck lymph node biopsy in 1992, no evidence of malignancy at [place] - No vasomotor symptoms or aquagenic pruritus suggestive of myeloproliferative disorders.  No B symptoms noted. - BCR/ABL by FISH was negative.  Jak 2 V6 1 7 of test, and reflex testing were negative.  This essentially rules out any myeloproliferative disorders.  Her platelet count today has improved.  Her white count is also normalized. - She could follow with her PMD.  If there is any significant changes in her blood counts, we will be glad to see him on an as-needed basis.

## 2017-12-26 NOTE — Progress Notes (Signed)
[facility] FOLLOW-UP NOTE  Patient Care Team: Sharion Balloon, FNP as PCP - General (Family Medicine) Patient, No Pcp Per (General Practice)  CHIEF COMPLAINTS:  Thrombocytosis.  HISTORY OF PRESENTING ILLNESS:  Natalie Cobb 57 y.o. female is seen in consultation today for further work-up and management of thrombocytosis.  Her most recent platelet count was in the range of 500,000.  She denies any recent infections or hospitalizations.  Denies any history of connective tissue disorders.  She complains of slight worsening of joint pains in the left side of the body.  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  Denies any fevers, night sweats or weight loss in the last 6 months.  She has occasional headaches and occasional nausea.  Appetite has been good.  No erythromelalgia or other vasomotor symptoms.  No echogenic pruritus.  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  No family history of polycythemia or other myeloproliferative disorders.  Paternal grandmother had brain tumor.   INTERVAL HISTORY:  Natalie Cobb 57 y.o. female returns for follow-up for thrombocytosis.   Endorses easy bruising; she feels like this is happening more frequently.  She is on Eliquis for A-fib.   Reviewed recent blood work results with her.  JAK2 and BCR-ABL both negative.  WBCs normal at 10.1. Platelet count repeated today and plt count 417,000.  She asks about taking an iron supplement; she likely does not need this supplementation at this time since her Hgb is normal.    Recommended she follow-up with her PCP with routine blood work.  We can continue to see her on PRN basis only; we are happy to see her in the future as needed. She agrees with this plan.      MEDICAL HISTORY:  Past Medical History:  Diagnosis Date  . Anxiety   . Asthma   . Depression   . Diverticulitis   . GERD (gastroesophageal reflux disease)   . Hernia of abdominal wall    . Hypertension   . IBS (irritable bowel syndrome)   . PAF (paroxysmal atrial fibrillation) ([place])   . Thrombocytosis ([place])    Splenectomy 1963  . Wilm's tumor of left kidney ([place])    Nephrectomy 1963, chemotherapy and [facility]    SURGICAL HISTORY: Past Surgical History:  Procedure Laterality Date  . Left nephrectomy  1963  . LYMPH GLAND EXCISION     neck in 20's  . ORIF ANKLE FRACTURE Right 10/18/2013   Procedure: OPEN REDUCTION INTERNAL FIXATION (ORIF) RIGHT TRIMALLEOLAR ANKLE FRACTURE;  Surgeon: Newt Minion, MD;  Location: [place];  Service: Orthopedics;  Laterality: Right;  . REVISION OF SCAR     of abdominal scar age 44  . Splenectomy  1963  . TUBAL LIGATION    . WISDOM TOOTH EXTRACTION      SOCIAL HISTORY: Social History   Socioeconomic History  . Marital status: Legally Separated    Spouse name: Not on file  . Number of children: Not on file  . Years of education: Not on file  . Highest education level: Not on file  Occupational History  . Not on file  Social Needs  . Financial resource strain: Not on file  . Food insecurity:    Worry: Not on file    Inability: Not on file  . Transportation needs:    Medical: Not on file    Non-medical: Not on file  Tobacco Use  .  Smoking status: Former Smoker    Years: 10.00  . Smokeless tobacco: Never Used  Substance and Sexual Activity  . Alcohol use: Yes    Comment: rarely  . Drug use: Yes    Types: Marijuana  . Sexual activity: Not Currently    Birth control/protection: Surgical    Comment: Quit around 2010  Lifestyle  . Physical activity:    Days per week: Not on file    Minutes per session: Not on file  . Stress: Not on file  Relationships  . Social connections:    Talks on phone: Not on file    Gets together: Not on file    Attends religious service: Not on file    Active member of club or organization: Not on file    Attends meetings of clubs or organizations: Not on file     Relationship status: Not on file  . Intimate partner violence:    Fear of current or ex partner: Not on file    Emotionally abused: Not on file    Physically abused: Not on file    Forced sexual activity: Not on file  Other Topics Concern  . Not on file  Social History Narrative  . Not on file    FAMILY HISTORY: Family History  Problem Relation Age of Onset  . Stroke Mother   . Pulmonary fibrosis Father   . Brain cancer Paternal Grandmother     ALLERGIES:  is allergic to compazine [prochlorperazine edisylate]; meclizine; thorazine [chlorpromazine]; and sulfa antibiotics.  MEDICATIONS:  Current Outpatient Medications  Medication Sig Dispense Refill  . albuterol (PROAIR HFA) 108 (90 Base) MCG/ACT inhaler Inhale 2 puffs into the lungs every 6 (six) hours as needed for wheezing or shortness of breath. 6.7 g 1  . apixaban (ELIQUIS) 5 MG TABS tablet Take 1 tablet (5 mg total) by mouth 2 (two) times daily. 180 tablet 5  . Cholecalciferol (VITAMIN D-3) 5000 UNITS TABS Take 5,000 Units by mouth every evening.     . clonazePAM (KLONOPIN) 1 MG tablet Take 1 tablet (1 mg total) by mouth 2 (two) times daily. 30 tablet 5  . fish oil-omega-3 fatty acids 1000 MG capsule Take 1 g by mouth every evening.     . metoprolol tartrate (LOPRESSOR) 50 MG tablet Take 1 tablet (50 mg total) by mouth 2 (two) times daily. 180 tablet 1  . montelukast (SINGULAIR) 10 MG tablet Take 1 tablet (10 mg total) by mouth at bedtime. 90 tablet 1  . pantoprazole (PROTONIX) 40 MG tablet Take 1 tablet (40 mg total) by mouth daily. 90 tablet 1  . simvastatin (ZOCOR) 40 MG tablet Take 1 tablet (40 mg total) by mouth every evening. 90 tablet 1  . venlafaxine XR (EFFEXOR-XR) 150 MG 24 hr capsule Take 1 capsule (150 mg total) by mouth daily with breakfast. 90 capsule 1   No current facility-administered medications for this visit.     REVIEW OF SYSTEMS:   Constitutional: Denies fevers, chills or abnormal night  sweats Eyes: Denies blurriness of vision, double vision or watery eyes.  Headaches present. Ears, nose, mouth, throat, and face: Denies mucositis or sore throat Respiratory: Denies cough, dyspnea or wheezes Cardiovascular: Denies palpitation, chest discomfort or lower extremity swelling Gastrointestinal:  Denies  heartburn or change in bowel habits.  Occasional nausea. Skin: Denies abnormal skin rashes Lymphatics: Denies new lymphadenopathy or easy bruising Neurological:Denies numbness, tingling or new weaknesses Behavioral/Psych: Mood is stable, no new changes  All other systems  were reviewed with the patient and are negative.  PHYSICAL EXAMINATION: ECOG PERFORMANCE STATUS: 0 - Asymptomatic I have reviewed her vitals. GENERAL:alert, no distress and comfortable SKIN: skin color, texture, turgor are normal, no rashes or significant lesions EYES: normal, conjunctiva are pink and non-injected, sclera clear OROPHARYNX:no exudate, no erythema and lips, buccal mucosa, and tongue normal  NECK: supple, thyroid normal size, non-tender, without nodularity LYMPH:  no palpable lymphadenopathy in the cervical, axillary or inguinal LUNGS: clear to auscultation and percussion with normal breathing effort HEART: regular rate & rhythm and no murmurs and no lower extremity edema ABDOMEN:abdomen soft, non-tender and normal bowel sounds Musculoskeletal:no cyanosis of digits and no clubbing  PSYCH: alert & oriented x 3 with fluent speech   LABORATORY DATA:  I have reviewed the data as listed Lab Results  Component Value Date   WBC 10.1 12/26/2017   HGB 13.3 12/26/2017   HCT 41.2 12/26/2017   MCV 97.9 12/26/2017   PLT 417 (H) 12/26/2017     Chemistry      Component Value Date/Time   NA 142 10/22/2017 1014   K 5.0 10/22/2017 1014   CL 104 10/22/2017 1014   CO2 20 10/22/2017 1014   BUN 21 10/22/2017 1014   CREATININE 0.82 10/22/2017 1014      Component Value Date/Time   CALCIUM 9.9  10/22/2017 1014   ALKPHOS 84 10/22/2017 1014   AST 20 10/22/2017 1014   ALT 17 10/22/2017 1014   BILITOT 0.5 10/22/2017 1014        ASSESSMENT & PLAN:  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  Thrombocytosis: - Splenectomy and left nephrectomy at 64 months of age (1963) due to left kidney Wilms tumor, status post chemo and radiation therapy at [facility] - Right neck lymph node biopsy in 1992, no evidence of malignancy at [place] - No vasomotor symptoms or aquagenic pruritus suggestive of myeloproliferative disorders.  No B symptoms noted. - BCR/ABL by FISH was negative.  Jak 2 V6 1 7 of test, and reflex testing were negative.  This essentially rules out any myeloproliferative disorders.  Her platelet count today has improved.  Her white count is also normalized. - She could follow with her PMD.  If there is any significant changes in her blood counts, we will be glad to see him on an as-needed basis.  No orders of the defined types were placed in this encounter.   All questions were answered. The patient knows to call the clinic with any problems, questions or concerns.    This note includes documentation from Mike Craze, NP, who was present during this patient's office visit and evaluation.  I have reviewed this note for its completeness and accuracy.  I have edited this note accordingly based on my findings and medical opinion.       Derek Jack, MD 12/26/2017 12:30 PM

## 2018-01-12 ENCOUNTER — Other Ambulatory Visit: Payer: Self-pay | Admitting: Family

## 2018-01-12 DIAGNOSIS — F331 Major depressive disorder, recurrent, moderate: Secondary | ICD-10-CM

## 2018-01-12 DIAGNOSIS — F419 Anxiety disorder, unspecified: Secondary | ICD-10-CM

## 2018-01-12 DIAGNOSIS — J4520 Mild intermittent asthma, uncomplicated: Secondary | ICD-10-CM

## 2018-01-21 ENCOUNTER — Other Ambulatory Visit: Payer: Self-pay | Admitting: Family

## 2018-01-21 DIAGNOSIS — F419 Anxiety disorder, unspecified: Secondary | ICD-10-CM

## 2018-01-27 ENCOUNTER — Emergency Department (HOSPITAL_COMMUNITY)
Admission: EM | Admit: 2018-01-27 | Discharge: 2018-01-27 | Disposition: A | Discharge status: Home / Self Care | Payer: Managed Care, Other (non HMO) | Attending: Emergency Medicine | Admitting: Emergency Medicine

## 2018-01-27 ENCOUNTER — Other Ambulatory Visit: Payer: Self-pay

## 2018-01-27 ENCOUNTER — Encounter (HOSPITAL_COMMUNITY): Payer: Self-pay | Admitting: *Deleted

## 2018-01-27 ENCOUNTER — Emergency Department (HOSPITAL_COMMUNITY): Payer: Managed Care, Other (non HMO)

## 2018-01-27 DIAGNOSIS — Y999 Unspecified external cause status: Secondary | ICD-10-CM | POA: Insufficient documentation

## 2018-01-27 DIAGNOSIS — Z87891 Personal history of nicotine dependence: Secondary | ICD-10-CM | POA: Insufficient documentation

## 2018-01-27 DIAGNOSIS — S92911A Unspecified fracture of right toe(s), initial encounter for closed fracture: Secondary | ICD-10-CM

## 2018-01-27 DIAGNOSIS — Y9389 Activity, other specified: Secondary | ICD-10-CM | POA: Diagnosis not present

## 2018-01-27 DIAGNOSIS — W208XXA Other cause of strike by thrown, projected or falling object, initial encounter: Secondary | ICD-10-CM | POA: Diagnosis not present

## 2018-01-27 DIAGNOSIS — J45909 Unspecified asthma, uncomplicated: Secondary | ICD-10-CM | POA: Diagnosis not present

## 2018-01-27 DIAGNOSIS — S92534A Nondisplaced fracture of distal phalanx of right lesser toe(s), initial encounter for closed fracture: Secondary | ICD-10-CM | POA: Insufficient documentation

## 2018-01-27 DIAGNOSIS — I10 Essential (primary) hypertension: Secondary | ICD-10-CM | POA: Insufficient documentation

## 2018-01-27 DIAGNOSIS — Y9289 Other specified places as the place of occurrence of the external cause: Secondary | ICD-10-CM | POA: Insufficient documentation

## 2018-01-27 DIAGNOSIS — Z79899 Other long term (current) drug therapy: Secondary | ICD-10-CM | POA: Diagnosis not present

## 2018-01-27 DIAGNOSIS — Z7901 Long term (current) use of anticoagulants: Secondary | ICD-10-CM | POA: Insufficient documentation

## 2018-01-27 DIAGNOSIS — S99921A Unspecified injury of right foot, initial encounter: Secondary | ICD-10-CM | POA: Diagnosis present

## 2018-01-27 DIAGNOSIS — S90121A Contusion of right lesser toe(s) without damage to nail, initial encounter: Secondary | ICD-10-CM | POA: Diagnosis not present

## 2018-01-27 NOTE — ED Triage Notes (Signed)
Pt states that she dropped two objects on right third toe yesterday, c/o pain and swelling to that toe,

## 2018-01-27 NOTE — ED Provider Notes (Signed)
[facility] EMERGENCY DEPARTMENT Provider Note   CSN: [id number] Arrival date & time: 01/27/18  0014  Time seen 01:50 AM   History   Chief Complaint Chief Complaint  Patient presents with  . Toe Injury    HPI Natalie Cobb is a 57 y.o. female.  HPI patient states about 1 PM today she had placed a glass on the counter and it fell and landed on her right middle toe.  She states about an hour later she was out in the yard and she dropped a spray bottle of weed killer on the same toe.  She states it has been getting more swollen and bruised and painful.  Patient is on Eliquis.  Past Medical History:  Diagnosis Date  . Anxiety   . Asthma   . Depression   . Diverticulitis   . GERD (gastroesophageal reflux disease)   . Hernia of abdominal wall   . Hypertension   . IBS (irritable bowel syndrome)   . PAF (paroxysmal atrial fibrillation) ([place])   . Thrombocytosis ([place])    Splenectomy 1963  . Wilm's tumor of left kidney ([place])    Nephrectomy 1963, chemotherapy and radiation [facility]    Patient Active Problem List   Diagnosis Date Noted  . 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 Hyperlipidemia 10/22/2017  . GERD (gastroesophageal reflux disease) 10/22/2017  . Mild intermittent asthma 10/22/2017  . Depression 10/22/2017  . H/O kidney removal 10/22/2017  . Obesity (BMI 30-39.9) 10/22/2017  . PAF (paroxysmal atrial fibrillation) ([place]) 11/05/2013  . HTN (hypertension) 11/05/2013  . Anxiety 11/05/2013  . Hx of fracture of ankle 11/05/2013  . Rapid atrial fibrillation ([place]) 11/05/2013    Past Surgical History:  Procedure Laterality Date  . Left nephrectomy  1963  . LYMPH GLAND EXCISION     neck in 20's  . ORIF ANKLE FRACTURE Right 10/18/2013   Procedure: OPEN REDUCTION INTERNAL FIXATION (ORIF) RIGHT TRIMALLEOLAR ANKLE FRACTURE;  Surgeon: Newt Minion, MD;  Location: [place];  Service: Orthopedics;  Laterality: Right;  . REVISION OF SCAR     of  abdominal scar age 85  . Splenectomy  1963  . TUBAL LIGATION    . WISDOM TOOTH EXTRACTION       OB History   None      Home Medications    Prior to Admission medications   Medication Sig Start Date End Date Taking? Authorizing Provider  albuterol (PROAIR HFA) 108 (90 Base) MCG/ACT inhaler Inhale 2 puffs into the lungs every 6 (six) hours as needed for wheezing or shortness of breath. 10/22/17   Sharion Balloon, FNP  apixaban (ELIQUIS) 5 MG TABS tablet Take 1 tablet (5 mg total) by mouth 2 (two) times daily. 10/22/17   Evelina Dun A, FNP  Cholecalciferol (VITAMIN D-3) 5000 UNITS TABS Take 5,000 Units by mouth every evening.     [provider]  clonazePAM (KLONOPIN) 1 MG tablet TAKE 1 TABLET BY MOUTH TWICE A DAY 01/21/18   Hawks, Christy A, FNP  fish oil-omega-3 fatty acids 1000 MG capsule Take 1 g by mouth every evening.     [provider]  metoprolol tartrate (LOPRESSOR) 50 MG tablet Take 1 tablet (50 mg total) by mouth 2 (two) times daily. 10/22/17   Evelina Dun A, FNP  montelukast (SINGULAIR) 10 MG tablet Take 1 tablet (10 mg total) by mouth at bedtime. 10/22/17   Evelina Dun A, FNP  pantoprazole (PROTONIX) 40 MG tablet Take 1 tablet (  40 mg total) by mouth daily. 12/07/17   Evelina Dun A, FNP  simvastatin (ZOCOR) 40 MG tablet Take 1 tablet (40 mg total) by mouth every evening. 10/22/17   Sharion Balloon, FNP  venlafaxine XR (EFFEXOR-XR) 150 MG 24 hr capsule Take 1 capsule (150 mg total) by mouth daily with breakfast. 10/22/17   Sharion Balloon, FNP    Family History Family History  Problem Relation Age of Onset  . Stroke Mother   . Pulmonary fibrosis Father   . Brain cancer Paternal Grandmother     Social History Social History   Tobacco Use  . Smoking status: Former Smoker    Years: 10.00  . Smokeless tobacco: Never Used  Substance Use Topics  . Alcohol use: Yes    Comment: rarely  . Drug use: Yes    Types: Marijuana     Allergies     Compazine [prochlorperazine edisylate]; Meclizine; Thorazine [chlorpromazine]; and Sulfa antibiotics   Review of Systems Review of Systems  All other systems reviewed and are negative.    Physical Exam Updated Vital Signs BP (!) 165/90   Pulse 65   Temp 98 F (36.7 C) (Oral)   Resp 16   Ht 5\' 3"  (1.6 m)   Wt 81.6 kg (180 lb)   SpO2 96%   BMI 31.89 kg/m   Vital signs normal except hypertension   Physical Exam  Constitutional: She appears well-developed and well-nourished.  HENT:  Head: Normocephalic and atraumatic.  Right Ear: External ear normal.  Left Ear: External ear normal.  Nose: Nose normal.  Eyes: Conjunctivae and EOM are normal.  Neck: Normal range of motion.  Cardiovascular: Normal rate.  Pulmonary/Chest: Effort normal. No respiratory distress.  Musculoskeletal: Normal range of motion. She exhibits edema and tenderness.  Patient has diffuse bruising of the distal right middle toe both on the dorsal and volar aspect of her toe.  She has fingernail polish on her toes.  Skin: Skin is warm and dry.  Psychiatric: She has a normal mood and affect. Her behavior is normal. Thought content normal.  Nursing note and vitals reviewed.        ED Treatments / Results  Labs (all labs ordered are listed, but only abnormal results are displayed) Labs Reviewed - No data to display  EKG None  Radiology Dg Toe 3rd Right  Result Date: 01/27/2018 CLINICAL DATA:  Injury to the right third toe with pain and swelling EXAM: RIGHT THIRD TOE COMPARISON:  None. FINDINGS: Subtle linear lucency mid to distal aspect of the third distal phalanx. No subluxation. Soft tissue swelling. IMPRESSION: Possible subtle nondisplaced fracture involving the mid to distal aspect of the third distal phalanx Electronically Signed   By: Donavan Foil M.D.   On: 01/27/2018 01:52    Procedures Procedures (including critical care time)  Medications Ordered in ED Medications - No data to  display   Initial Impression / Assessment and Plan / ED Course  I have reviewed the triage vital signs and the nursing notes.  Pertinent labs & imaging results that were available during my care of the patient were reviewed by me and considered in my medical decision making (see chart for details).    I had nursing staff remove her female polys to see if she had a subungual hematoma.  Patient is on Eliquis which explains the degree of bruising she has.  When I looked at her x-ray I did not see an obvious fracture however the radiologist feels  her could be a subtle hairline fracture of the distal phalanx.  That corresponds to the area of bruising that she has.  Fingernail polish was removed and there was no subungual hematoma.  Patient states she will buddy tape her toes at home, she did not want the postop shoe.  Final Clinical Impressions(s) / ED Diagnoses   Final diagnoses:  Contusion of toe of right foot, unspecified toe, initial encounter  Closed nondisplaced fracture of phalanx of toe of right foot, unspecified toe, initial encounter    ED Discharge Orders    None    OTC  Acetaminophen  Plan discharge  Rolland Porter, MD, Barbette Or, MD 01/27/18 [phone]

## 2018-01-27 NOTE — Discharge Instructions (Signed)
Elevate your foot. Use ice packs for the swelling and bruising. You can take acetaminophen for pain while taking the eliquis. Buddy tape your toes for comfort. Try to wear shoes with a hard sole. Recheck with Dr Aline Brochure if not improving over the next week.

## 2018-02-05 NOTE — Progress Notes (Deleted)
Cardiology Office Note  Date: 02/05/2018   ID: Macey, Wurtz March 04, 1961, MRN [id number]  PCP: Sharion Balloon, FNP  Consulting cardiologist: Rozann Lesches, MD   No chief complaint on file.   History of Present Illness: Natalie Cobb is a 57 y.o. female referred for cardiology consultation by Ms. Hawks NP with diagnosis of paroxysmal atrial fibrillation.  Past Medical History:  Diagnosis Date  . Anxiety   . Asthma   . Depression   . Diverticulitis   . GERD (gastroesophageal reflux disease)   . Hernia of abdominal wall   . Hypertension   . IBS (irritable bowel syndrome)   . PAF (paroxysmal atrial fibrillation) ([place])   . Thrombocytosis ([place])    Splenectomy 1963  . Wilm's tumor of left kidney ([place])    Nephrectomy 1963, chemotherapy and radiation [facility]    Past Surgical History:  Procedure Laterality Date  . Left nephrectomy  1963  . LYMPH GLAND EXCISION     neck in 20's  . ORIF ANKLE FRACTURE Right 10/18/2013   Procedure: OPEN REDUCTION INTERNAL FIXATION (ORIF) RIGHT TRIMALLEOLAR ANKLE FRACTURE;  Surgeon: Newt Minion, MD;  Location: [place];  Service: Orthopedics;  Laterality: Right;  . REVISION OF SCAR     of abdominal scar age 53  . Splenectomy  1963  . TUBAL LIGATION    . WISDOM TOOTH EXTRACTION      Current Outpatient Medications  Medication Sig Dispense Refill  . albuterol (PROAIR HFA) 108 (90 Base) MCG/ACT inhaler Inhale 2 puffs into the lungs every 6 (six) hours as needed for wheezing or shortness of breath. 6.7 g 1  . apixaban (ELIQUIS) 5 MG TABS tablet Take 1 tablet (5 mg total) by mouth 2 (two) times daily. 180 tablet 5  . Cholecalciferol (VITAMIN D-3) 5000 UNITS TABS Take 5,000 Units by mouth every evening.     . clonazePAM (KLONOPIN) 1 MG tablet TAKE 1 TABLET BY MOUTH TWICE A DAY 30 tablet 3  . fish oil-omega-3 fatty acids 1000 MG capsule Take 1 g by mouth every evening.     . metoprolol tartrate (LOPRESSOR) 50 MG tablet  Take 1 tablet (50 mg total) by mouth 2 (two) times daily. 180 tablet 1  . montelukast (SINGULAIR) 10 MG tablet Take 1 tablet (10 mg total) by mouth at bedtime. 90 tablet 1  . pantoprazole (PROTONIX) 40 MG tablet Take 1 tablet (40 mg total) by mouth daily. 90 tablet 1  . simvastatin (ZOCOR) 40 MG tablet Take 1 tablet (40 mg total) by mouth every evening. 90 tablet 1  . venlafaxine XR (EFFEXOR-XR) 150 MG 24 hr capsule Take 1 capsule (150 mg total) by mouth daily with breakfast. 90 capsule 1   No current facility-administered medications for this visit.    Allergies:  Compazine [prochlorperazine edisylate]; Meclizine; Thorazine [chlorpromazine]; and Sulfa antibiotics   Social History: The patient  reports that she has quit smoking. She quit after 10.00 years of use. She has never used smokeless tobacco. She reports that she drinks alcohol. She reports that she has current or past drug history. Drug: Marijuana.   Family History: The patient's family history includes Brain cancer in her paternal grandmother; Pulmonary fibrosis in her father; Stroke in her mother.   ROS:  Please see the history of present illness. Otherwise, complete review of systems is positive for {NONE DEFAULTED:18576::"none"}.  All other systems are reviewed and negative.   Physical Exam: VS:  There were no  vitals taken for this visit., BMI There is no height or weight on file to calculate BMI.  Wt Readings from Last 3 Encounters:  01/27/18 180 lb (81.6 kg)  12/26/17 184 lb (83.5 kg)  12/03/17 186 lb 9.6 oz (84.6 kg)    General: Patient appears comfortable at rest. HEENT: Conjunctiva and lids normal, oropharynx clear with moist mucosa. Neck: Supple, no elevated JVP or carotid bruits, no thyromegaly. Lungs: Clear to auscultation, nonlabored breathing at rest. Cardiac: Regular rate and rhythm, no S3 or significant systolic murmur, no pericardial rub. Abdomen: Soft, nontender, no hepatomegaly, bowel sounds present, no  guarding or rebound. Extremities: No pitting edema, distal pulses 2+. Skin: Warm and dry. Musculoskeletal: No kyphosis. Neuropsychiatric: Alert and oriented x3, affect grossly appropriate.  ECG: I personally reviewed the tracing from 11/05/2013 which showed sinus rhythm with short bursts of apparent atrial fibrillation.  Recent Labwork: 10/22/2017: ALT 17; AST 20; BUN 21; Creatinine, Ser 0.82; Potassium 5.0; Sodium 142 12/26/2017: Hemoglobin 13.3; Platelets 417     Component Value Date/Time   CHOL 196 10/22/2017 1014   TRIG 181 (H) 10/22/2017 1014   HDL 45 10/22/2017 1014   CHOLHDL 4.4 10/22/2017 1014   LDLCALC 115 (H) 10/22/2017 1014    Other Studies Reviewed Today:  Echocardiogram 11/06/2013: Study Conclusions  Left ventricle: The cavity size was normal. Wall thickness was normal. Systolic function was normal. The estimated ejection fraction was in the range of 55% to 60%. Wall motion was normal; there were no regional wall motion abnormalities. Doppler parameters are consistent with abnormal left ventricular relaxation (grade 1 diastolic dysfunction).   Assessment and Plan:    Current medicines were reviewed with the patient today.  No orders of the defined types were placed in this encounter.   Disposition:  Signed, Satira Sark, MD, [facility] 02/05/2018 4:31 PM    [facility] [place], [place], [place] [address] Phone: [phone]; Fax: [phone]

## 2018-02-08 ENCOUNTER — Ambulatory Visit: Payer: Self-pay | Admitting: Cardiology

## 2018-02-08 ENCOUNTER — Encounter

## 2018-02-12 ENCOUNTER — Encounter: Payer: Self-pay | Admitting: Cardiology

## 2018-03-11 ENCOUNTER — Encounter: Payer: Self-pay | Admitting: Family Medicine

## 2018-03-11 ENCOUNTER — Ambulatory Visit (INDEPENDENT_AMBULATORY_CARE_PROVIDER_SITE_OTHER): Payer: Self-pay | Admitting: Family Medicine

## 2018-03-11 VITALS — BP 118/69 | HR 81 | Temp 98.5°F | Ht 63.0 in | Wt 183.0 lb

## 2018-03-11 DIAGNOSIS — B9689 Other specified bacterial agents as the cause of diseases classified elsewhere: Secondary | ICD-10-CM

## 2018-03-11 DIAGNOSIS — J019 Acute sinusitis, unspecified: Secondary | ICD-10-CM

## 2018-03-11 DIAGNOSIS — Q8901 Asplenia (congenital): Secondary | ICD-10-CM

## 2018-03-11 DIAGNOSIS — R300 Dysuria: Secondary | ICD-10-CM

## 2018-03-11 LAB — URINALYSIS, COMPLETE
Bilirubin, UA: NEGATIVE
GLUCOSE, UA: NEGATIVE
KETONES UA: NEGATIVE
LEUKOCYTES UA: NEGATIVE
NITRITE UA: NEGATIVE
Protein, UA: NEGATIVE
SPEC GRAV UA: 1.02 (ref 1.005–1.030)
Urobilinogen, Ur: 0.2 mg/dL (ref 0.2–1.0)
pH, UA: 5 (ref 5.0–7.5)

## 2018-03-11 LAB — MICROSCOPIC EXAMINATION
BACTERIA UA: NONE SEEN
Renal Epithel, UA: NONE SEEN /hpf
WBC, UA: NONE SEEN /hpf (ref 0–5)

## 2018-03-11 MED ORDER — CEFDINIR 300 MG PO CAPS
300.0000 mg | ORAL_CAPSULE | Freq: Two times a day (BID) | ORAL | 0 refills | Status: DC
Start: 2018-03-11 — End: 2018-04-19

## 2018-03-11 MED ORDER — CEFTRIAXONE SODIUM 1 G IJ SOLR
1.0000 g | Freq: Once | INTRAMUSCULAR | Status: AC
  Administered 2018-03-11: 1 g via INTRAMUSCULAR

## 2018-03-11 MED ORDER — PREDNISONE 10 MG PO TABS: ORAL_TABLET | ORAL | 0 refills | Status: DC

## 2018-03-11 NOTE — Patient Instructions (Signed)

## 2018-03-11 NOTE — Progress Notes (Addendum)
Subjective: CC: not feeling well PCP: Sharion Balloon, FNP Natalie Cobb is a 57 y.o. female presenting to clinic today for:  1. Ear pain/ sinusitis Patient reports a couple week history of left ear pain, gland swelling in her throat and neck and feeling fatigued.  She states that this has happened in the past and she has actually been evaluated by ENT for this.  She often requires Rocephin IM and a prednisone Dosepak in order for the symptoms to resolve.  She is used peroxide in the left ear with little improvement in symptoms.  She is also use Tylenol and her albuterol but she reports that she does not really feel short of breath.  Denies any fevers, nausea, vomiting.  She does report chills at home.  Surgical history is significant for asplenia.  2.  Dysuria She is had a one-week history of increased urinary frequency and intermittent dysuria.  Denies hematuria, fevers, chills, nausea, vomiting.  She thinks that she may have the urinary frequency and intermittent dysuria as a result of her new work schedule.  She reports fair hydration, citing that she drinks quite a bit of tea that she waters down.   ROS: Per HPI  Allergies  Allergen Reactions  . Compazine [Prochlorperazine Edisylate] Other (See Comments)    Patient has "muscular" reaction to all medications that end in "ZINE"  . Meclizine     Patient has "muscular" reaction to all medications that end in "ZINE"  . Thorazine [Chlorpromazine] Other (See Comments)    Patient has "muscular" reaction to all medications that end in "ZINE"  . Sulfa Antibiotics Rash   Past Medical History:  Diagnosis Date  . Anxiety   . Asthma   . Depression   . Diverticulitis   . GERD (gastroesophageal reflux disease)   . Hernia of abdominal wall   . Hypertension   . IBS (irritable bowel syndrome)   . PAF (paroxysmal atrial fibrillation) ([place])   . 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  albuterol (PROAIR HFA) 108 (90 Base) MCG/ACT inhaler, Inhale 2 puffs into the lungs every 6 (six) hours as needed for wheezing or shortness of breath., Disp: 6.7 g, Rfl: 1 .  apixaban (ELIQUIS) 5 MG TABS tablet, Take 1 tablet (5 mg total) by mouth 2 (two) times daily., Disp: 180 tablet, Rfl: 5 .  Cholecalciferol (VITAMIN D-3) 5000 UNITS TABS, Take 5,000 Units by mouth every evening. , Disp: , Rfl:  .  clonazePAM (KLONOPIN) 1 MG tablet, TAKE 1 TABLET BY MOUTH TWICE A DAY, Disp: 30 tablet, Rfl: 3 .  fish oil-omega-3 fatty acids 1000 MG capsule, Take 1 g by mouth every evening. , Disp: , Rfl:  .  metoprolol tartrate (LOPRESSOR) 50 MG tablet, Take 1 tablet (50 mg total) by mouth 2 (two) times daily., Disp: 180 tablet, Rfl: 1 .  montelukast (SINGULAIR) 10 MG tablet, Take 1 tablet (10 mg total) by mouth at bedtime., Disp: 90 tablet, Rfl: 1 .  pantoprazole (PROTONIX) 40 MG tablet, Take 1 tablet (40 mg total) by mouth daily., Disp: 90 tablet, Rfl: 1 .  simvastatin (ZOCOR) 40 MG tablet, Take 1 tablet (40 mg total) by mouth every evening., Disp: 90 tablet, Rfl: 1 .  venlafaxine XR (EFFEXOR-XR) 150 MG 24 hr capsule, Take 1 capsule (150 mg total) by mouth daily  with breakfast., Disp: 90 capsule, Rfl: 1 Social History   Socioeconomic History  . Marital status: Legally Separated    Spouse name: Not on file  . Number of children: Not on file  . Years of education: Not on file  . Highest education level: Not on file  Occupational History  . Not on file  Social Needs  . Financial resource strain: Not on file  . Food insecurity:    Worry: Not on file    Inability: Not on file  . Transportation needs:    Medical: Not on file    Non-medical: Not on file  Tobacco Use  . Smoking status: Former Smoker    Years: 10.00  . Smokeless tobacco: Never Used  Substance and Sexual Activity  .  Alcohol use: Yes    Comment: rarely  . Drug use: Yes    Types: Marijuana  . Sexual activity: Not Currently    Birth control/protection: Surgical    Comment: Quit around 2010  Lifestyle  . Physical activity:    Days per week: Not on file    Minutes per session: Not on file  . Stress: Not on file  Relationships  . Social connections:    Talks on phone: Not on file    Gets together: Not on file    Attends religious service: Not on file    Active member of club or organization: Not on file    Attends meetings of clubs or organizations: Not on file    Relationship status: Not on file  . Intimate partner violence:    Fear of current or ex partner: Not on file    Emotionally abused: Not on file    Physically abused: Not on file    Forced sexual activity: Not on file  Other Topics Concern  . Not on file  Social History Narrative  . Not on file   Family History  Problem Relation Age of Onset  . Stroke Mother   . Pulmonary fibrosis Father   . Brain cancer Paternal Grandmother     Objective: Office vital signs reviewed. BP 118/69   Pulse 81   Temp 98.5 F (36.9 C)   Ht 5\' 3"  (1.6 m)   Wt 183 lb (83 kg)   BMI 32.42 kg/m   Physical Examination:  General: Awake, alert, well nourished, No acute distress HEENT: Normal    Neck: No masses palpated.  Mild enlargement of submandibular glands.    Ears: Tympanic membranes intact, dulled light reflex, no erythema, mild bulging of TMs bilaterally    Eyes: PERRLA, extraocular membranes intact, sclera white    Nose: nasal turbinates moist, clear nasal discharge    Throat: moist mucus membranes, no erythema, tonsils surgically absent.  Airway is patent Cardio: regular rate and rhythm, S1S2 heard, no murmurs appreciated Pulm: clear to auscultation bilaterally, no wheezes, rhonchi or rales; normal work of breathing on room air GU: No suprapubic tenderness to palpation  Assessment/ Plan: 57 y.o. female   1. Dysuria Urinalysis did  not demonstrate evidence of infection.  Patient is afebrile and well-appearing.  Push oral fluids. - Urinalysis, Complete  2. Acute bacterial sinusitis Given symptoms and associated lymph node enlargement, empiric treatment for acute bacterial sinusitis was started.  She was given a dose of Rocephin IM here in office.  She was prescribed Omnicef 300 mg p.o. twice daily.  She is also given a prednisone Dosepak, since this was the regimen recommended by her ENT in the past. -  cefTRIAXone (ROCEPHIN) injection 1 g  3. Asplenia Reinforced need for emergent evaluation the emergency department if she becomes febrile.  Patient to follow-up with PCP as needed.   Orders Placed This Encounter  Procedures  . Microscopic Examination  . Urinalysis, Complete   Meds ordered this encounter  Medications  . cefTRIAXone (ROCEPHIN) injection 1 g  . predniSONE (DELTASONE) 10 MG tablet    Sig: Take 60mg  by mouth day 1, 50mg  day 2, 40mg  day 3, 30mg  day 4, 20mg  day 5, 10mg  day 6.  Then stop.    Dispense:  21 tablet    Refill:  0  . cefdinir (OMNICEF) 300 MG capsule    Sig: Take 1 capsule (300 mg total) by mouth 2 (two) times daily. 1 po BID    Dispense:  20 capsule    Refill:  [place], DO [place] [phone]

## 2018-03-24 ENCOUNTER — Other Ambulatory Visit: Payer: Self-pay | Admitting: Family

## 2018-03-24 DIAGNOSIS — F419 Anxiety disorder, unspecified: Secondary | ICD-10-CM

## 2018-03-25 ENCOUNTER — Other Ambulatory Visit: Payer: Self-pay

## 2018-03-25 ENCOUNTER — Telehealth: Payer: Self-pay | Admitting: Family

## 2018-03-25 DIAGNOSIS — F419 Anxiety disorder, unspecified: Secondary | ICD-10-CM

## 2018-03-25 MED ORDER — CLONAZEPAM 1 MG PO TABS: 1.0000 mg | ORAL_TABLET | Freq: Two times a day (BID) | ORAL | 1 refills | Status: DC

## 2018-03-25 NOTE — Telephone Encounter (Signed)
Do you know anything about this, patient questioning why Clonazepman refill denied.  She takes twice daily, 30 (not sure why since takes bid) were prescribed on 01/19/18, with 3 refills.  Called [facility], no refills on file.

## 2018-03-25 NOTE — Telephone Encounter (Signed)
Patient called upset that she has not heard anything back about her rx. Spoke with Dr. Evette Doffing who is English as a second language teacher.  Per Dr. Evette Doffing she is going to send in #[facility] r-1 to [facility] for patient and then patient is to see PCP.  Patient had apt with PCP 9/20 and moved apt up tp 9/16.   Please send in medication for patient.

## 2018-03-25 NOTE — Telephone Encounter (Signed)
Pt called wants to know why her rf request for clonazepam was denied. Please call her at [phone]. Only has 1 pill left.

## 2018-04-16 ENCOUNTER — Other Ambulatory Visit: Payer: Self-pay | Admitting: Family

## 2018-04-16 DIAGNOSIS — J4520 Mild intermittent asthma, uncomplicated: Secondary | ICD-10-CM

## 2018-04-16 DIAGNOSIS — I480 Paroxysmal atrial fibrillation: Secondary | ICD-10-CM

## 2018-04-16 DIAGNOSIS — E785 Hyperlipidemia, unspecified: Secondary | ICD-10-CM

## 2018-04-16 DIAGNOSIS — I10 Essential (primary) hypertension: Secondary | ICD-10-CM

## 2018-04-19 ENCOUNTER — Ambulatory Visit (INDEPENDENT_AMBULATORY_CARE_PROVIDER_SITE_OTHER): Payer: Managed Care, Other (non HMO) | Admitting: Family

## 2018-04-19 ENCOUNTER — Encounter: Payer: Self-pay | Admitting: Family

## 2018-04-19 VITALS — BP 128/90 | HR 68 | Temp 97.5°F | Ht 63.0 in | Wt 181.8 lb

## 2018-04-19 DIAGNOSIS — J069 Acute upper respiratory infection, unspecified: Secondary | ICD-10-CM

## 2018-04-19 MED ORDER — FLUTICASONE PROPIONATE 50 MCG/ACT NA SUSP: 2.0000 | Freq: Every day | NASAL | 6 refills | Status: DC

## 2018-04-19 NOTE — Progress Notes (Signed)
   Subjective:    Patient ID: Natalie Cobb, female    DOB: [birth year], 57 y.o.   MRN: [id number]  Chief Complaint  Patient presents with  . Generalized Body Aches  . feeling tired   Pt presents to the office today with general body aches and fatigue that started yesterday. She states she woke up and did not feel "right" and stayed home from work and slept all day. She states today she continues to feel the same.  Fever   This is a new problem. The current episode started yesterday. The maximum temperature noted was 99 to 99.9 F. Associated symptoms include diarrhea, headaches, nausea and sleepiness. Pertinent negatives include no congestion, coughing, ear pain, muscle aches, sore throat, urinary pain or vomiting. She has tried nothing for the symptoms. The treatment provided no relief.        Review of Systems  Constitutional: Positive for fever.  HENT: Negative for congestion, ear pain and sore throat.   Respiratory: Negative for cough.   Gastrointestinal: Positive for diarrhea and nausea. Negative for vomiting.  Genitourinary: Negative for dysuria.  Neurological: Positive for headaches.  All other systems reviewed and are negative.      Objective:   Physical Exam  Constitutional: She is oriented to person, place, and time. She appears well-developed and well-nourished. No distress.  HENT:  Head: Normocephalic and atraumatic.  Right Ear: External ear normal.  Left Ear: External ear normal.  Nose: Mucosal edema and rhinorrhea present.  Mouth/Throat: Posterior oropharyngeal erythema present.  Eyes: Pupils are equal, round, and reactive to light.  Neck: Normal range of motion. Neck supple. No thyromegaly present.  Cardiovascular: Normal rate, regular rhythm, normal heart sounds and intact distal pulses.  No murmur heard. Pulmonary/Chest: Effort normal and breath sounds normal. No respiratory distress. She has no wheezes.  Abdominal: Soft. Bowel sounds are normal. She  exhibits no distension. There is no tenderness.  Musculoskeletal: Normal range of motion. She exhibits no edema or tenderness.  Neurological: She is alert and oriented to person, place, and time. She has normal reflexes. No cranial nerve deficit.  Skin: Skin is warm and dry.  Psychiatric: She has a normal mood and affect. Her behavior is normal. Judgment and thought content normal.  Vitals reviewed.     BP 128/90   Pulse 68   Temp (!) 97.5 F (36.4 C) (Oral)   Ht 5\' 3"  (1.6 m)   Wt 181 lb 12.8 oz (82.5 kg)   BMI 32.20 kg/m      Assessment & Plan:  Natalie Cobb comes in today with chief complaint of Generalized Body Aches; feeling tired; and less alert   Diagnosis and orders addressed:  1. Viral upper respiratory tract infection - Take meds as prescribed - Use a cool mist humidifier  -Use saline nose sprays frequently -Force fluids -For any cough or congestion  Use plain Mucinex- regular strength or max strength is fine -For fever or aces or pains- take tylenol or ibuprofen. -Throat lozenges if help -New toothbrush in 3 days RTO if symptoms worsen or do not improve  - fluticasone (FLONASE) 50 MCG/ACT nasal spray; Place 2 sprays into both nostrils daily.  Dispense: 16 g; Refill: [place], FNP

## 2018-04-19 NOTE — Patient Instructions (Signed)
Upper Respiratory Infection, Adult Most upper respiratory infections (URIs) are caused by a virus. A URI affects the nose, throat, and upper air passages. The most common type of URI is often called "the common cold." Follow these instructions at home:  Take medicines only as told by your doctor.  Gargle warm saltwater or take cough drops to comfort your throat as told by your doctor.  Use a warm mist humidifier or inhale steam from a shower to increase air moisture. This may make it easier to breathe.  Drink enough fluid to keep your pee (urine) clear or pale yellow.  Eat soups and other clear broths.  Have a healthy diet.  Rest as needed.  Go back to work when your fever is gone or your doctor says it is okay. ? You may need to stay home longer to avoid giving your URI to others. ? You can also wear a face mask and wash your hands often to prevent spread of the virus.  Use your inhaler more if you have asthma.  Do not use any tobacco products, including cigarettes, chewing tobacco, or electronic cigarettes. If you need help quitting, ask your doctor. Contact a doctor if:  You are getting worse, not better.  Your symptoms are not helped by medicine.  You have chills.  You are getting more short of breath.  You have brown or red mucus.  You have yellow or brown discharge from your nose.  You have pain in your face, especially when you bend forward.  You have a fever.  You have puffy (swollen) neck glands.  You have pain while swallowing.  You have white areas in the back of your throat. Get help right away if:  You have very bad or constant: ? Headache. ? Ear pain. ? Pain in your forehead, behind your eyes, and over your cheekbones (sinus pain). ? Chest pain.  You have long-lasting (chronic) lung disease and any of the following: ? Wheezing. ? Long-lasting cough. ? Coughing up blood. ? A change in your usual mucus.  You have a stiff neck.  You have  changes in your: ? Vision. ? Hearing. ? Thinking. ? Mood. This information is not intended to replace advice given to you by your health care provider. Make sure you discuss any questions you have with your health care provider. Document Released: 01/10/2008 Document Revised: 03/26/2016 Document Reviewed: 10/29/2013 Elsevier Interactive Patient Education  2018 Elsevier Inc.  

## 2018-04-22 ENCOUNTER — Encounter: Payer: Self-pay | Admitting: Family

## 2018-04-22 ENCOUNTER — Ambulatory Visit (INDEPENDENT_AMBULATORY_CARE_PROVIDER_SITE_OTHER): Payer: Managed Care, Other (non HMO) | Admitting: Family

## 2018-04-22 VITALS — BP 133/85 | HR 73 | Temp 99.0°F | Ht 63.0 in | Wt 179.4 lb

## 2018-04-22 DIAGNOSIS — M545 Low back pain, unspecified: Secondary | ICD-10-CM

## 2018-04-22 DIAGNOSIS — F331 Major depressive disorder, recurrent, moderate: Secondary | ICD-10-CM

## 2018-04-22 DIAGNOSIS — K219 Gastro-esophageal reflux disease without esophagitis: Secondary | ICD-10-CM | POA: Diagnosis not present

## 2018-04-22 DIAGNOSIS — F1320 Sedative, hypnotic or anxiolytic dependence, uncomplicated: Secondary | ICD-10-CM

## 2018-04-22 DIAGNOSIS — F419 Anxiety disorder, unspecified: Secondary | ICD-10-CM | POA: Diagnosis not present

## 2018-04-22 DIAGNOSIS — J4520 Mild intermittent asthma, uncomplicated: Secondary | ICD-10-CM

## 2018-04-22 DIAGNOSIS — I10 Essential (primary) hypertension: Secondary | ICD-10-CM | POA: Diagnosis not present

## 2018-04-22 DIAGNOSIS — E785 Hyperlipidemia, unspecified: Secondary | ICD-10-CM

## 2018-04-22 DIAGNOSIS — E669 Obesity, unspecified: Secondary | ICD-10-CM

## 2018-04-22 DIAGNOSIS — I480 Paroxysmal atrial fibrillation: Secondary | ICD-10-CM

## 2018-04-22 DIAGNOSIS — K429 Umbilical hernia without obstruction or gangrene: Secondary | ICD-10-CM

## 2018-04-22 DIAGNOSIS — Z79899 Other long term (current) drug therapy: Secondary | ICD-10-CM

## 2018-04-22 DIAGNOSIS — R5383 Other fatigue: Secondary | ICD-10-CM

## 2018-04-22 MED ORDER — CLONAZEPAM 1 MG PO TABS: 1.0000 mg | ORAL_TABLET | Freq: Two times a day (BID) | ORAL | 2 refills | Status: DC

## 2018-04-22 MED ORDER — BACLOFEN 10 MG PO TABS: 10.0000 mg | ORAL_TABLET | Freq: Three times a day (TID) | ORAL | 0 refills | Status: DC

## 2018-04-22 NOTE — Patient Instructions (Signed)

## 2018-04-22 NOTE — Progress Notes (Signed)
Subjective:    Patient ID: Natalie Cobb, female    DOB: 03-11-1961, 57 y.o.   MRN: [id number]  Chief Complaint  Patient presents with  . Medical Management of Chronic Issues    six month recheck    Hypertension  This is a chronic problem. The current episode started more than 1 year ago. The problem has been resolved since onset. The problem is controlled. Associated symptoms include anxiety, malaise/fatigue and shortness of breath ("at times). Pertinent negatives include no peripheral edema. Risk factors for coronary artery disease include dyslipidemia, obesity and sedentary lifestyle. The current treatment provides moderate improvement. There is no history of kidney disease, CAD/MI, CVA or heart failure.  Hyperlipidemia  This is a chronic problem. The current episode started more than 1 year ago. The problem is uncontrolled. Recent lipid tests were reviewed and are high. Exacerbating diseases include obesity. Associated symptoms include shortness of breath ("at times). Current antihyperlipidemic treatment includes statins. The current treatment provides moderate improvement of lipids. Risk factors for coronary artery disease include dyslipidemia, hypertension, a sedentary lifestyle and stress.  Asthma  She complains of cough, frequent throat clearing, shortness of breath ("at times) and wheezing. This is a chronic problem. The current episode started more than 1 year ago. The problem has been waxing and waning. Associated symptoms include malaise/fatigue. Her past medical history is significant for asthma.  Anxiety  Presents for follow-up visit. Symptoms include compulsions, depressed mood, excessive worry, irritability, nervous/anxious behavior, panic, restlessness and shortness of breath ("at times). Symptoms occur most days. The severity of symptoms is moderate. The quality of sleep is good.   Her past medical history is significant for asthma.  Depression         This is a chronic  problem.  The current episode started more than 1 year ago.   The onset quality is gradual.   The problem occurs daily.  The problem has been waxing and waning since onset.  Associated symptoms include helplessness, hopelessness, irritable, restlessness and sad.  Past treatments include SNRIs - Serotonin and norepinephrine reuptake inhibitors.  Past medical history includes anxiety.   Back Pain  This is a new problem. The current episode started more than 1 month ago. The problem occurs intermittently. The problem has been waxing and waning since onset. The pain is present in the lumbar spine. The pain does not radiate. The pain is at a severity of 9/10. The pain is mild. She has tried NSAIDs for the symptoms. The treatment provided mild relief.  A Fib  PT currently taking Eliquis daily. Stable.    Review of Systems  Constitutional: Positive for irritability and malaise/fatigue.  Respiratory: Positive for cough, shortness of breath ("at times) and wheezing.   Musculoskeletal: Positive for back pain.  Psychiatric/Behavioral: Positive for depression. The patient is nervous/anxious.   All other systems reviewed and are negative.      Objective:   Physical Exam  Constitutional: She is oriented to person, place, and time. She appears well-developed and well-nourished. She is irritable. No distress.  HENT:  Head: Normocephalic and atraumatic.  Right Ear: External ear normal.  Left Ear: External ear normal.  Mouth/Throat: Oropharynx is clear and moist.  Eyes: Pupils are equal, round, and reactive to light.  Neck: Normal range of motion. Neck supple. No thyromegaly present.  Cardiovascular: Normal rate, regular rhythm, normal heart sounds and intact distal pulses.  No murmur heard. Pulmonary/Chest: Effort normal and breath sounds normal. No respiratory distress. She has no  wheezes.  Abdominal: Soft. Bowel sounds are normal. She exhibits no distension. There is no tenderness. A hernia is present.   Musculoskeletal: Normal range of motion. She exhibits no edema or tenderness.  Neurological: She is alert and oriented to person, place, and time. She has normal reflexes. No cranial nerve deficit.  Skin: Skin is warm and dry.  Psychiatric: Her behavior is normal. Judgment and thought content normal. Her mood appears anxious.  Vitals reviewed.     BP 133/85   Pulse 73   Temp 99 F (37.2 C) (Oral)   Ht '5\' 3"'$  (1.6 m)   Wt 179 lb 6.4 oz (81.4 kg)   BMI 31.78 kg/m      Assessment & Plan:  Natalie Cobb comes in today with chief complaint of Medical Management of Chronic Issues (six month recheck)   Diagnosis and orders addressed:  1. Anxiety - CMP14+EGFR - CBC with Differential/Platelet - clonazePAM (KLONOPIN) 1 MG tablet; Take 1 tablet (1 mg total) by mouth 2 (two) times daily.  Dispense: 60 tablet; Refill: 2 - Ambulatory referral to Psychiatry  2. Moderate episode of recurrent major depressive disorder (HCC) - CMP14+EGFR - CBC with Differential/Platelet  3. Gastroesophageal reflux disease without esophagitis - CMP14+EGFR - CBC with Differential/Platelet - Ambulatory referral to Psychiatry  4. Essential hypertension - CMP14+EGFR - CBC with Differential/Platelet  5. Mild intermittent asthma without complication - IZX28+FVWA - CBC with Differential/Platelet  6. Obesity (BMI 30-39.9) - CMP14+EGFR - CBC with Differential/Platelet  7. Hyperlipidemia, unspecified hyperlipidemia type - CMP14+EGFR - CBC with Differential/Platelet  8. PAF (paroxysmal atrial fibrillation) (HCC) - CMP14+EGFR - CBC with Differential/Platelet  9. Fatigue, unspecified type - CMP14+EGFR - CBC with Differential/Platelet - TSH  10. Umbilical hernia without obstruction and without gangrene - CMP14+EGFR - CBC with Differential/Platelet - Ambulatory referral to General Surgery  11. Benzodiazepine dependence (HCC) - CMP14+EGFR - CBC with Differential/Platelet - ToxASSURE Select 13  (MW), Urine  12. Controlled substance agreement signed - CMP14+EGFR - CBC with Differential/Platelet - ToxASSURE Select 13 (MW), Urine  13. Acute bilateral low back pain without sciatica Will give Baclofen today as needed - CMP14+EGFR - CBC with Differential/Platelet - baclofen (LIORESAL) 10 MG tablet; Take 1 tablet (10 mg total) by mouth 3 (three) times daily.  Dispense: 30 each; Refill: 0   Labs pending Health Maintenance reviewed Diet and exercise encouraged  Follow up plan: 3 months    Evelina Dun, FNP

## 2018-04-23 ENCOUNTER — Telehealth: Payer: Self-pay | Admitting: Family

## 2018-04-23 LAB — CBC WITH DIFFERENTIAL/PLATELET
BASOS ABS: 0.1 10*3/uL (ref 0.0–0.2)
Basos: 1 %
EOS (ABSOLUTE): 0.2 10*3/uL (ref 0.0–0.4)
Eos: 1 %
HEMOGLOBIN: 14.7 g/dL (ref 11.1–15.9)
Hematocrit: 43.9 % (ref 34.0–46.6)
IMMATURE GRANS (ABS): 0 10*3/uL (ref 0.0–0.1)
Immature Granulocytes: 0 %
LYMPHS ABS: 5 10*3/uL — AB (ref 0.7–3.1)
LYMPHS: 44 %
MCH: 30.7 pg (ref 26.6–33.0)
MCHC: 33.5 g/dL (ref 31.5–35.7)
MCV: 92 fL (ref 79–97)
Monocytes Absolute: 0.9 10*3/uL (ref 0.1–0.9)
Monocytes: 8 %
NEUTROS ABS: 5.3 10*3/uL (ref 1.4–7.0)
Neutrophils: 46 %
Platelets: 462 10*3/uL — ABNORMAL HIGH (ref 150–450)
RBC: 4.79 x10E6/uL (ref 3.77–5.28)
RDW: 12.8 % (ref 12.3–15.4)
WBC: 11.5 10*3/uL — ABNORMAL HIGH (ref 3.4–10.8)

## 2018-04-23 LAB — TSH: TSH: 1.33 u[IU]/mL (ref 0.450–4.500)

## 2018-04-23 LAB — CMP14+EGFR
A/G RATIO: 1.6 (ref 1.2–2.2)
ALK PHOS: 94 IU/L (ref 39–117)
ALT: 16 IU/L (ref 0–32)
AST: 19 IU/L (ref 0–40)
Albumin: 4.6 g/dL (ref 3.5–5.5)
BILIRUBIN TOTAL: 1 mg/dL (ref 0.0–1.2)
BUN/Creatinine Ratio: 22 (ref 9–23)
BUN: 17 mg/dL (ref 6–24)
CHLORIDE: 103 mmol/L (ref 96–106)
CO2: 21 mmol/L (ref 20–29)
Calcium: 10.5 mg/dL — ABNORMAL HIGH (ref 8.7–10.2)
Creatinine, Ser: 0.77 mg/dL (ref 0.57–1.00)
GFR calc non Af Amer: 86 mL/min/{1.73_m2} (ref >59)
GFR, EST AFRICAN AMERICAN: 99 mL/min/{1.73_m2} (ref >59)
Globulin, Total: 2.8 g/dL (ref 1.5–4.5)
Glucose: 103 mg/dL — ABNORMAL HIGH (ref 65–99)
POTASSIUM: 4.9 mmol/L (ref 3.5–5.2)
Sodium: 139 mmol/L (ref 134–144)
Total Protein: 7.4 g/dL (ref 6.0–8.5)

## 2018-04-24 NOTE — Telephone Encounter (Signed)
Aware of results. 

## 2018-04-25 ENCOUNTER — Ambulatory Visit: Payer: Managed Care, Other (non HMO) | Admitting: Family

## 2018-04-26 ENCOUNTER — Ambulatory Visit: Payer: Managed Care, Other (non HMO) | Admitting: Family

## 2018-04-26 LAB — TOXASSURE SELECT 13 (MW), URINE

## 2018-05-07 ENCOUNTER — Ambulatory Visit: Payer: Managed Care, Other (non HMO) | Admitting: General Surgery

## 2018-05-09 ENCOUNTER — Other Ambulatory Visit: Payer: Self-pay | Admitting: Family

## 2018-05-09 DIAGNOSIS — F419 Anxiety disorder, unspecified: Secondary | ICD-10-CM

## 2018-05-09 DIAGNOSIS — F331 Major depressive disorder, recurrent, moderate: Secondary | ICD-10-CM

## 2018-05-09 DIAGNOSIS — J4520 Mild intermittent asthma, uncomplicated: Secondary | ICD-10-CM

## 2018-05-11 ENCOUNTER — Other Ambulatory Visit: Payer: Self-pay | Admitting: Family

## 2018-05-11 DIAGNOSIS — K219 Gastro-esophageal reflux disease without esophagitis: Secondary | ICD-10-CM

## 2018-05-14 ENCOUNTER — Ambulatory Visit: Payer: Managed Care, Other (non HMO) | Admitting: General Surgery

## 2018-07-12 NOTE — Progress Notes (Deleted)
Psychiatric Initial Adult Assessment   Patient Identification: Natalie Cobb MRN:  [id number] Date of Evaluation:  07/12/2018 Referral Source: *** Chief Complaint:   Visit Diagnosis: No diagnosis found.  History of Present Illness:   Frayda L Provencio is a 57 y.o. year old female with a history of depression, anxiety, Afib, hypertension, hyperlipidemia, who is referred for depression.     Associated Signs/Symptoms: Depression Symptoms:  {DEPRESSION SYMPTOMS:20000} (Hypo) Manic Symptoms:  {BHH MANIC SYMPTOMS:22872} Anxiety Symptoms:  {BHH ANXIETY SYMPTOMS:22873} Psychotic Symptoms:  {BHH PSYCHOTIC SYMPTOMS:22874} PTSD Symptoms: {BHH PTSD SYMPTOMS:22875}  Past Psychiatric History:  Outpatient:  Psychiatry admission:  Previous suicide attempt:  Past trials of medication:  History of violence:   Previous Psychotropic Medications: {YES/NO:21197}  Substance Abuse History in the last 12 months:  {yes no:314532}  Consequences of Substance Abuse: {BHH CONSEQUENCES OF SUBSTANCE ABUSE:22880}  Past Medical History:  Past Medical History:  Diagnosis Date  . Anxiety   . Asthma   . Depression   . Diverticulitis   . GERD (gastroesophageal reflux disease)   . Hernia of abdominal wall   . Hypertension   . IBS (irritable bowel syndrome)   . PAF (paroxysmal atrial fibrillation) ([place])   . Thrombocytosis ([place])    Splenectomy 1963  . 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 Left nephrectomy  1963  . LYMPH GLAND EXCISION     neck in 20's  . ORIF ANKLE FRACTURE Right 10/18/2013   Procedure: OPEN REDUCTION INTERNAL FIXATION (ORIF) RIGHT TRIMALLEOLAR ANKLE FRACTURE;  Surgeon: Newt Minion, MD;  Location: [place];  Service: Orthopedics;  Laterality: Right;  . REVISION OF SCAR     of abdominal scar age 6  . Splenectomy  1963  . TUBAL LIGATION    . WISDOM TOOTH  EXTRACTION      Family Psychiatric History: ***  Family History:  Family History  Problem Relation Age of Onset  . Stroke Mother   . Pulmonary fibrosis Father   . Brain cancer Paternal Grandmother     Social History:   Social History   Socioeconomic History  . Marital status: Legally Separated    Spouse name: Not on file  . Number of children: Not on file  . Years of education: Not on file  . Highest education level: Not on file  Occupational History  . Not on file  Social Needs  . Financial resource strain: Not on file  . Food insecurity:    Worry: Not on file    Inability: Not on file  . Transportation needs:    Medical: Not on file    Non-medical: Not on file  Tobacco Use  . Smoking status: Former Smoker    Years: 10.00  . Smokeless tobacco: Never Used  Substance and Sexual Activity  . Alcohol use: Yes    Comment: rarely  . Drug use: Yes    Types: Marijuana  . Sexual activity: Not Currently    Birth control/protection: Surgical    Comment: Quit around 2010  Lifestyle  . Physical activity:    Days per week: Not on file    Minutes per session: Not on file  . Stress: Not on file  Relationships  . Social connections:    Talks on phone: Not on file    Gets together: Not on file    Attends religious service: Not on file  Active member of club or organization: Not on file    Attends meetings of clubs or organizations: Not on file    Relationship status: Not on file  Other Topics Concern  . Not on file  Social History Narrative  . Not on file    Additional Social History: ***  Allergies:   Allergies  Allergen Reactions  . Compazine [Prochlorperazine Edisylate] Other (See Comments)    Patient has "muscular" reaction to all medications that end in "ZINE"  . Meclizine     Patient has "muscular" reaction to all medications that end in "ZINE"  . Thorazine [Chlorpromazine] Other (See Comments)    Patient has "muscular" reaction to all medications that  end in "ZINE"  . Sulfa Antibiotics Rash    Metabolic Disorder Labs: No results found for: HGBA1C, MPG No results found for: PROLACTIN Lab Results  Component Value Date   CHOL 196 10/22/2017   TRIG 181 (H) 10/22/2017   HDL 45 10/22/2017   CHOLHDL 4.4 10/22/2017   LDLCALC 115 (H) 10/22/2017   Lab Results  Component Value Date   TSH 1.330 04/22/2018    Therapeutic Level Labs: No results found for: LITHIUM No results found for: CBMZ No results found for: VALPROATE  Current Medications: Current Outpatient Medications  Medication Sig Dispense Refill  . albuterol (PROAIR HFA) 108 (90 Base) MCG/ACT inhaler Inhale 2 puffs into the lungs every 6 (six) hours as needed for wheezing or shortness of breath. 6.7 g 1  . apixaban (ELIQUIS) 5 MG TABS tablet Take 1 tablet (5 mg total) by mouth 2 (two) times daily. 180 tablet 5  . baclofen (LIORESAL) 10 MG tablet Take 1 tablet (10 mg total) by mouth 3 (three) times daily. 30 each 0  . Cholecalciferol (VITAMIN D-3) 5000 UNITS TABS Take 5,000 Units by mouth every evening.     . clonazePAM (KLONOPIN) 1 MG tablet Take 1 tablet (1 mg total) by mouth 2 (two) times daily. 60 tablet 2  . fish oil-omega-3 fatty acids 1000 MG capsule Take 1 g by mouth every evening.     . fluticasone (FLONASE) 50 MCG/ACT nasal spray Place 2 sprays into both nostrils daily. 16 g 6  . metoprolol tartrate (LOPRESSOR) 50 MG tablet TAKE 1 TABLET BY MOUTH TWICE A DAY 180 tablet 1  . montelukast (SINGULAIR) 10 MG tablet TAKE 1 TABLET BY MOUTH EVERYDAY AT BEDTIME 90 tablet 1  . pantoprazole (PROTONIX) 40 MG tablet TAKE 1 TABLET BY MOUTH EVERY DAY 90 tablet 1  . simvastatin (ZOCOR) 40 MG tablet TAKE 1 TABLET BY MOUTH EVERY DAY IN THE EVENING 90 tablet 0  . venlafaxine XR (EFFEXOR-XR) 150 MG 24 hr capsule TAKE 1 CAPSULE (150 MG TOTAL) BY MOUTH DAILY WITH BREAKFAST. 90 capsule 0   No current facility-administered medications for this visit.     Musculoskeletal: Strength & Muscle  Tone: within normal limits Gait & Station: normal Patient leans: N/A  Psychiatric Specialty Exam: ROS  There were no vitals taken for this visit.There is no height or weight on file to calculate BMI.  General Appearance: Fairly Groomed  Eye Contact:  Good  Speech:  Clear and Coherent  Volume:  Normal  Mood:  {BHH MOOD:22306}  Affect:  {Affect (PAA):22687}  Thought Process:  Coherent  Orientation:  Full (Time, Place, and Person)  Thought Content:  Logical  Suicidal Thoughts:  {ST/HT (PAA):22692}  Homicidal Thoughts:  {ST/HT (PAA):22692}  Memory:  Immediate;   Good  Judgement:  {Judgement (PAA):22694}  Insight:  {Insight (PAA):22695}  Psychomotor Activity:  Normal  Concentration:  Concentration: Good and Attention Span: Good  Recall:  Good  Fund of Knowledge:Good  Language: Good  Akathisia:  No  Handed:  Right  AIMS (if indicated):  not done  Assets:  Communication Skills Desire for Improvement  ADL's:  Intact  Cognition: WNL  Sleep:  {BHH GOOD/FAIR/POOR:22877}   Screenings: PHQ2-9     Office Visit from 04/22/2018 in [place] Visit from 04/19/2018 in [place] Visit from 03/11/2018 in [place] Visit from 10/22/2017 in [place]  PHQ-2 Total Score  2  2  0  1  PHQ-9 Total Score  7  8  -  -      Assessment and Plan:  Assessment  Plan  The patient demonstrates the following risk factors for suicide: Chronic risk factors for suicide include: {Chronic Risk Factors for QMGQQPY:19509326}. Acute risk factors for suicide include: {Acute Risk Factors for ZTIWPYK:99833825}. Protective factors for this patient include: {Protective Factors for Suicide KNLZ:76734193}. Considering these factors, the overall suicide risk at this point appears to be {Desc; low/moderate/high:110033}. Patient {ACTION; IS/IS XTK:24097353} appropriate for outpatient follow up.   Norman Clay, MD 12/6/201910:52 AM

## 2018-07-15 ENCOUNTER — Ambulatory Visit (HOSPITAL_COMMUNITY): Payer: Self-pay | Admitting: Psychiatry

## 2018-07-22 ENCOUNTER — Ambulatory Visit (INDEPENDENT_AMBULATORY_CARE_PROVIDER_SITE_OTHER): Payer: 59 | Admitting: Family

## 2018-07-22 ENCOUNTER — Encounter: Payer: Self-pay | Admitting: Family

## 2018-07-22 VITALS — BP 106/71 | HR 77 | Temp 97.8°F | Ht 63.0 in | Wt 190.8 lb

## 2018-07-22 DIAGNOSIS — F419 Anxiety disorder, unspecified: Secondary | ICD-10-CM | POA: Diagnosis not present

## 2018-07-22 DIAGNOSIS — Z905 Acquired absence of kidney: Secondary | ICD-10-CM

## 2018-07-22 DIAGNOSIS — I10 Essential (primary) hypertension: Secondary | ICD-10-CM

## 2018-07-22 DIAGNOSIS — E785 Hyperlipidemia, unspecified: Secondary | ICD-10-CM

## 2018-07-22 DIAGNOSIS — F331 Major depressive disorder, recurrent, moderate: Secondary | ICD-10-CM

## 2018-07-22 DIAGNOSIS — K219 Gastro-esophageal reflux disease without esophagitis: Secondary | ICD-10-CM | POA: Diagnosis not present

## 2018-07-22 DIAGNOSIS — Z79899 Other long term (current) drug therapy: Secondary | ICD-10-CM

## 2018-07-22 DIAGNOSIS — F1320 Sedative, hypnotic or anxiolytic dependence, uncomplicated: Secondary | ICD-10-CM

## 2018-07-22 DIAGNOSIS — J4520 Mild intermittent asthma, uncomplicated: Secondary | ICD-10-CM | POA: Diagnosis not present

## 2018-07-22 DIAGNOSIS — E8881 Metabolic syndrome: Secondary | ICD-10-CM

## 2018-07-22 DIAGNOSIS — E669 Obesity, unspecified: Secondary | ICD-10-CM

## 2018-07-22 LAB — CBC WITH DIFFERENTIAL/PLATELET
BASOS: 1 %
Basophils Absolute: 0.1 10*3/uL (ref 0.0–0.2)
EOS (ABSOLUTE): 0.2 10*3/uL (ref 0.0–0.4)
EOS: 2 %
HEMATOCRIT: 42 % (ref 34.0–46.6)
Hemoglobin: 14 g/dL (ref 11.1–15.9)
IMMATURE GRANS (ABS): 0 10*3/uL (ref 0.0–0.1)
IMMATURE GRANULOCYTES: 0 %
LYMPHS: 37 %
Lymphocytes Absolute: 4.4 10*3/uL — ABNORMAL HIGH (ref 0.7–3.1)
MCH: 31 pg (ref 26.6–33.0)
MCHC: 33.3 g/dL (ref 31.5–35.7)
MCV: 93 fL (ref 79–97)
MONOS ABS: 0.9 10*3/uL (ref 0.1–0.9)
Monocytes: 8 %
NEUTROS PCT: 52 %
Neutrophils Absolute: 6.4 10*3/uL (ref 1.4–7.0)
PLATELETS: 479 10*3/uL — AB (ref 150–450)
RBC: 4.51 x10E6/uL (ref 3.77–5.28)
RDW: 12.1 % — ABNORMAL LOW (ref 12.3–15.4)
WBC: 12.1 10*3/uL — ABNORMAL HIGH (ref 3.4–10.8)

## 2018-07-22 LAB — CMP14+EGFR
A/G RATIO: 1.4 (ref 1.2–2.2)
ALT: 19 IU/L (ref 0–32)
AST: 23 IU/L (ref 0–40)
Albumin: 4.2 g/dL (ref 3.5–5.5)
Alkaline Phosphatase: 98 IU/L (ref 39–117)
BUN/Creatinine Ratio: 22 (ref 9–23)
BUN: 19 mg/dL (ref 6–24)
Bilirubin Total: 0.8 mg/dL (ref 0.0–1.2)
CALCIUM: 9.6 mg/dL (ref 8.7–10.2)
CO2: 22 mmol/L (ref 20–29)
CREATININE: 0.86 mg/dL (ref 0.57–1.00)
Chloride: 106 mmol/L (ref 96–106)
GFR calc Af Amer: 87 mL/min/{1.73_m2} (ref >59)
GFR, EST NON AFRICAN AMERICAN: 75 mL/min/{1.73_m2} (ref >59)
GLOBULIN, TOTAL: 3.1 g/dL (ref 1.5–4.5)
Glucose: 142 mg/dL — ABNORMAL HIGH (ref 65–99)
POTASSIUM: 5 mmol/L (ref 3.5–5.2)
SODIUM: 143 mmol/L (ref 134–144)
TOTAL PROTEIN: 7.3 g/dL (ref 6.0–8.5)

## 2018-07-22 MED ORDER — DEXLANSOPRAZOLE 60 MG PO CPDR: 60.0000 mg | DELAYED_RELEASE_CAPSULE | Freq: Every day | ORAL | 3 refills | Status: DC

## 2018-07-22 MED ORDER — CLONAZEPAM 1 MG PO TABS: 1.0000 mg | ORAL_TABLET | Freq: Two times a day (BID) | ORAL | 2 refills | Status: DC

## 2018-07-22 NOTE — Patient Instructions (Signed)

## 2018-07-22 NOTE — Progress Notes (Signed)
Subjective:    Patient ID: Natalie Cobb, female    DOB: Jun 29, 1961, 57 y.o.   MRN: [id number]  Chief Complaint  Patient presents with  . Medical Management of Chronic Issues    three month recheck, refills   PT presents to the office for chronic follow up. PT has A Fib and taking Eliquis daily.  Hypertension  This is a chronic problem. The current episode started more than 1 year ago. The problem is controlled. Associated symptoms include anxiety and malaise/fatigue. Pertinent negatives include no peripheral edema or shortness of breath. Risk factors for coronary artery disease include dyslipidemia and obesity. The current treatment provides moderate improvement. There is no history of CAD/MI, CVA or heart failure.  Asthma  She complains of cough and wheezing. There is no difficulty breathing, hemoptysis or shortness of breath. This is a chronic problem. The current episode started more than 1 year ago. The problem occurs intermittently. The problem has been waxing and waning. Associated symptoms include heartburn and malaise/fatigue. Her symptoms are alleviated by leukotriene antagonist. She reports moderate improvement on treatment. Her past medical history is significant for asthma.  Gastroesophageal Reflux  She complains of belching, coughing, heartburn and wheezing. This is a chronic problem. The current episode started more than 1 year ago. The problem occurs occasionally. The problem has been waxing and waning. The symptoms are aggravated by certain foods. Risk factors include obesity. She has tried a PPI for the symptoms. The treatment provided mild relief.  Hyperlipidemia  This is a chronic problem. The current episode started more than 1 year ago. The problem is uncontrolled. Recent lipid tests were reviewed and are high. Exacerbating diseases include obesity. Pertinent negatives include no shortness of breath. Current antihyperlipidemic treatment includes statins. The current  treatment provides moderate improvement of lipids. Risk factors for coronary artery disease include dyslipidemia, female sex, hypertension and a sedentary lifestyle.  Anxiety  Presents for follow-up visit. Symptoms include decreased concentration, excessive worry, irritability and nervous/anxious behavior. Patient reports no restlessness or shortness of breath. Symptoms occur rarely. The severity of symptoms is moderate.   Her past medical history is significant for asthma.  Depression         This is a chronic problem.  The current episode started more than 1 year ago.   The onset quality is gradual.   The problem occurs intermittently.  Associated symptoms include decreased concentration and sad.  Associated symptoms include no helplessness, no hopelessness, not irritable and no restlessness.  Past treatments include SNRIs - Serotonin and norepinephrine reuptake inhibitors.  Past medical history includes anxiety.   Metabolic Syndrome  Pt does not have any scheduled physically active, but does walk her dog regularly. Admits she eats what she wants.     Review of Systems  Constitutional: Positive for irritability and malaise/fatigue.  Respiratory: Positive for cough and wheezing. Negative for hemoptysis and shortness of breath.   Gastrointestinal: Positive for heartburn.  Psychiatric/Behavioral: Positive for decreased concentration and depression. The patient is nervous/anxious.   All other systems reviewed and are negative.      Objective:   Physical Exam Vitals signs reviewed.  Constitutional:      General: She is not irritable.She is not in acute distress.    Appearance: She is well-developed.  HENT:     Head: Normocephalic and atraumatic.     Right Ear: External ear normal.  Eyes:     Pupils: Pupils are equal, round, and reactive to light.  Neck:  Musculoskeletal: Normal range of motion and neck supple.     Thyroid: No thyromegaly.  Cardiovascular:     Rate and Rhythm: Normal  rate and regular rhythm.     Heart sounds: Normal heart sounds. No murmur.  Pulmonary:     Effort: Pulmonary effort is normal. No respiratory distress.     Breath sounds: Normal breath sounds. No wheezing.  Abdominal:     General: Bowel sounds are increased. There is no distension.     Palpations: Abdomen is soft.     Tenderness: There is no abdominal tenderness.  Musculoskeletal: Normal range of motion.        General: No tenderness.  Skin:    General: Skin is warm and dry.  Neurological:     Mental Status: She is alert and oriented to person, place, and time.     Cranial Nerves: No cranial nerve deficit.     Deep Tendon Reflexes: Reflexes are normal and symmetric.  Psychiatric:        Behavior: Behavior normal.        Thought Content: Thought content normal.        Judgment: Judgment normal.       BP 106/71   Pulse 77   Temp 97.8 F (36.6 C) (Oral)   Ht '5\' 3"'$  (1.6 m)   Wt 190 lb 12.8 oz (86.5 kg)   BMI 33.80 kg/m      Assessment & Plan:  Natalie Cobb comes in today with chief complaint of Medical Management of Chronic Issues (three month recheck, refills)   Diagnosis and orders addressed:  1. Essential hypertension - CMP14+EGFR - CBC with Differential/Platelet - dexlansoprazole (DEXILANT) 60 MG capsule; Take 1 capsule (60 mg total) by mouth daily.  Dispense: 90 capsule; Refill: 3  2. Mild intermittent asthma without complication - LKG40+NUUV - CBC with Differential/Platelet  3. Gastroesophageal reflux disease without esophagitis Will stop Protonix and try Dexilant today -Diet discussed- Avoid fried, spicy, citrus foods, caffeine and alcohol -Do not eat 2-3 hours before bedtime -Encouraged small frequent meals -Avoid NSAID's - CMP14+EGFR - CBC with Differential/Platelet  4. Anxiety - clonazePAM (KLONOPIN) 1 MG tablet; Take 1 tablet (1 mg total) by mouth 2 (two) times daily.  Dispense: 60 tablet; Refill: 2 - CMP14+EGFR - CBC with  Differential/Platelet  5. Moderate episode of recurrent major depressive disorder (HCC) - CMP14+EGFR - CBC with Differential/Platelet  6. H/O kidney removal - CMP14+EGFR - CBC with Differential/Platelet  7. Obesity (BMI 30-39.9) - CMP14+EGFR - CBC with Differential/Platelet  8. Benzodiazepine dependence (HCC) - CMP14+EGFR - CBC with Differential/Platelet  9. Controlled substance agreement signed - CMP14+EGFR - CBC with Differential/Platelet  10. Metabolic syndrome - OZD66+YQIH - CBC with Differential/Platelet - Lipid panel  11. Hyperlipidemia, unspecified hyperlipidemia type - CMP14+EGFR - CBC with Differential/Platelet - Lipid panel   Labs pending Health Maintenance reviewed Diet and exercise encouraged  Follow up plan: 6 months    Evelina Dun, FNP

## 2018-07-23 ENCOUNTER — Other Ambulatory Visit: Payer: Self-pay | Admitting: Family

## 2018-07-23 LAB — LIPID PANEL
CHOL/HDL RATIO: 4.4 ratio (ref 0.0–4.4)
Cholesterol, Total: 197 mg/dL (ref 100–199)
HDL: 45 mg/dL (ref >39)
LDL Calculated: 129 mg/dL — ABNORMAL HIGH (ref 0–99)
TRIGLYCERIDES: 116 mg/dL (ref 0–149)
VLDL Cholesterol Cal: 23 mg/dL (ref 5–40)

## 2018-07-23 LAB — SPECIMEN STATUS REPORT

## 2018-07-24 ENCOUNTER — Other Ambulatory Visit: Payer: Self-pay | Admitting: Family

## 2018-07-29 LAB — SPECIMEN STATUS REPORT

## 2018-07-30 ENCOUNTER — Other Ambulatory Visit: Payer: Self-pay | Admitting: Family

## 2018-08-15 ENCOUNTER — Other Ambulatory Visit: Payer: Self-pay | Admitting: Family

## 2018-08-15 DIAGNOSIS — F419 Anxiety disorder, unspecified: Secondary | ICD-10-CM

## 2018-08-15 DIAGNOSIS — F331 Major depressive disorder, recurrent, moderate: Secondary | ICD-10-CM

## 2018-08-15 DIAGNOSIS — J4520 Mild intermittent asthma, uncomplicated: Secondary | ICD-10-CM

## 2018-08-15 DIAGNOSIS — E785 Hyperlipidemia, unspecified: Secondary | ICD-10-CM

## 2018-08-16 NOTE — Telephone Encounter (Signed)
OV 07/22/18 rtc 6 mos

## 2018-10-09 ENCOUNTER — Telehealth: Payer: Self-pay | Admitting: Family

## 2018-10-09 NOTE — Telephone Encounter (Signed)
Aware.  Wear a mask , stay out of public places as much as possible.  We do not have a medicine to take prophylactic

## 2018-10-24 ENCOUNTER — Ambulatory Visit (INDEPENDENT_AMBULATORY_CARE_PROVIDER_SITE_OTHER): Payer: 59 | Admitting: Family

## 2018-10-24 ENCOUNTER — Other Ambulatory Visit: Payer: Self-pay

## 2018-10-24 ENCOUNTER — Encounter: Payer: Self-pay | Admitting: Family

## 2018-10-24 VITALS — BP 153/85 | HR 56 | Temp 97.3°F | Ht 63.0 in | Wt 191.0 lb

## 2018-10-24 DIAGNOSIS — J4520 Mild intermittent asthma, uncomplicated: Secondary | ICD-10-CM

## 2018-10-24 DIAGNOSIS — I10 Essential (primary) hypertension: Secondary | ICD-10-CM | POA: Diagnosis not present

## 2018-10-24 DIAGNOSIS — F1320 Sedative, hypnotic or anxiolytic dependence, uncomplicated: Secondary | ICD-10-CM

## 2018-10-24 DIAGNOSIS — E669 Obesity, unspecified: Secondary | ICD-10-CM

## 2018-10-24 DIAGNOSIS — I480 Paroxysmal atrial fibrillation: Secondary | ICD-10-CM | POA: Diagnosis not present

## 2018-10-24 DIAGNOSIS — E785 Hyperlipidemia, unspecified: Secondary | ICD-10-CM

## 2018-10-24 DIAGNOSIS — F419 Anxiety disorder, unspecified: Secondary | ICD-10-CM | POA: Diagnosis not present

## 2018-10-24 DIAGNOSIS — F331 Major depressive disorder, recurrent, moderate: Secondary | ICD-10-CM

## 2018-10-24 DIAGNOSIS — Z79899 Other long term (current) drug therapy: Secondary | ICD-10-CM

## 2018-10-24 MED ORDER — CLONAZEPAM 1 MG PO TABS: 1.0000 mg | ORAL_TABLET | Freq: Two times a day (BID) | ORAL | 5 refills | Status: DC

## 2018-10-24 NOTE — Progress Notes (Signed)
Subjective:    Patient ID: Natalie Cobb, female    DOB: August 24, 1960, 58 y.o.   MRN: [id number]  Chief Complaint  Patient presents with  . Medical Management of Chronic Issues   PT presents to the office for chronic follow up. PT has A Fib and taking Eliquis daily Hypertension  This is a chronic problem. The current episode started more than 1 year ago. Associated symptoms include anxiety and peripheral edema ("at times"). Pertinent negatives include no malaise/fatigue or shortness of breath. Risk factors for coronary artery disease include dyslipidemia and sedentary lifestyle. The current treatment provides moderate improvement.  Anxiety  Presents for follow-up visit. Symptoms include depressed mood, excessive worry, irritability and nervous/anxious behavior. Patient reports no shortness of breath. Symptoms occur occasionally.   Her past medical history is significant for asthma.  Depression         This is a chronic problem.  The current episode started more than 1 year ago.   The onset quality is gradual.   The problem occurs intermittently.  The problem has been waxing and waning since onset.  Associated symptoms include fatigue, irritable, decreased interest and sad.  Associated symptoms include no helplessness and no hopelessness.  Past treatments include SSRIs - Selective serotonin reuptake inhibitors.  Compliance with treatment is good.  Past medical history includes anxiety.   Hyperlipidemia  This is a chronic problem. The current episode started more than 1 year ago. The problem is uncontrolled. Recent lipid tests were reviewed and are high. Exacerbating diseases include obesity. Pertinent negatives include no shortness of breath. Current antihyperlipidemic treatment includes diet change and statins. The current treatment provides moderate improvement of lipids. Risk factors for coronary artery disease include dyslipidemia, hypertension and a sedentary lifestyle.  Gastroesophageal  Reflux  She complains of belching, coughing and heartburn. She reports no wheezing. This is a chronic problem. The current episode started more than 1 year ago. The problem occurs occasionally. The symptoms are aggravated by certain foods. Associated symptoms include fatigue. She has tried a PPI for the symptoms. The treatment provided moderate relief.  Asthma  She complains of cough. There is no shortness of breath or wheezing. This is a chronic problem. The current episode started more than 1 year ago. The problem occurs intermittently. The problem has been waxing and waning. Associated symptoms include heartburn. Pertinent negatives include no malaise/fatigue. Her past medical history is significant for asthma.      Review of Systems  Constitutional: Positive for fatigue and irritability. Negative for malaise/fatigue.  Respiratory: Positive for cough. Negative for shortness of breath and wheezing.   Gastrointestinal: Positive for heartburn.  Psychiatric/Behavioral: Positive for depression. The patient is nervous/anxious.   All other systems reviewed and are negative.      Objective:   Physical Exam Vitals signs reviewed.  Constitutional:      General: She is irritable. She is not in acute distress.    Appearance: She is well-developed.  HENT:     Head: Normocephalic and atraumatic.     Right Ear: Tympanic membrane normal.     Left Ear: Tympanic membrane normal.  Eyes:     Pupils: Pupils are equal, round, and reactive to light.  Neck:     Musculoskeletal: Normal range of motion and neck supple.     Thyroid: No thyromegaly.  Cardiovascular:     Rate and Rhythm: Normal rate and regular rhythm.     Heart sounds: Normal heart sounds. No murmur.  Pulmonary:  Effort: Pulmonary effort is normal. No respiratory distress.     Breath sounds: Normal breath sounds. No wheezing.  Abdominal:     General: Bowel sounds are normal. There is no distension.     Palpations: Abdomen is soft.      Tenderness: There is no abdominal tenderness.  Musculoskeletal: Normal range of motion.        General: No tenderness.  Skin:    General: Skin is warm and dry.  Neurological:     Mental Status: She is alert and oriented to person, place, and time.     Cranial Nerves: No cranial nerve deficit.     Deep Tendon Reflexes: Reflexes are normal and symmetric.  Psychiatric:        Mood and Affect: Mood is anxious.        Behavior: Behavior normal.        Thought Content: Thought content normal.        Judgment: Judgment normal.        BP (!) 153/85   Pulse (!) 56   Temp (!) 97.3 F (36.3 C) (Oral)   Ht 5\' 3"  (1.6 m)   Wt 191 lb (86.6 kg)   BMI 33.83 kg/m   Assessment & Plan:  Natalie Cobb comes in today with chief complaint of Medical Management of Chronic Issues   Diagnosis and orders addressed:  1. Essential hypertension  2. PAF (paroxysmal atrial fibrillation) ([place])  3. Mild intermittent asthma without complication  4. Anxiety - clonazePAM (KLONOPIN) 1 MG tablet; Take 1 tablet (1 mg total) by mouth 2 (two) times daily.  Dispense: 60 tablet; Refill: 5  5. Benzodiazepine dependence (HCC) - ToxASSURE Select 13 (MW), Urine - clonazePAM (KLONOPIN) 1 MG tablet; Take 1 tablet (1 mg total) by mouth 2 (two) times daily.  Dispense: 60 tablet; Refill: 5  6. Controlled substance agreement signed - ToxASSURE Select 13 (MW), Urine  7. Moderate episode of recurrent major depressive disorder ([place])  8. Obesity (BMI 30-39.9)  9. Hyperlipidemia, unspecified hyperlipidemia type   Labs pending Pt reviewed in [place] controlled database- No red flags noted Health Maintenance reviewed Diet and exercise encouraged  Follow up plan: 3 months    Evelina Dun, FNP

## 2018-10-24 NOTE — Patient Instructions (Signed)

## 2018-11-08 ENCOUNTER — Other Ambulatory Visit: Payer: Self-pay | Admitting: Family

## 2018-11-08 DIAGNOSIS — J4520 Mild intermittent asthma, uncomplicated: Secondary | ICD-10-CM

## 2018-11-08 DIAGNOSIS — I10 Essential (primary) hypertension: Secondary | ICD-10-CM

## 2018-11-08 DIAGNOSIS — I480 Paroxysmal atrial fibrillation: Secondary | ICD-10-CM

## 2018-11-14 ENCOUNTER — Other Ambulatory Visit: Payer: Self-pay | Admitting: Family

## 2018-11-14 DIAGNOSIS — I480 Paroxysmal atrial fibrillation: Secondary | ICD-10-CM

## 2018-11-19 ENCOUNTER — Other Ambulatory Visit: Payer: Self-pay | Admitting: Family

## 2018-11-19 DIAGNOSIS — K219 Gastro-esophageal reflux disease without esophagitis: Secondary | ICD-10-CM

## 2018-11-26 ENCOUNTER — Telehealth: Payer: Self-pay | Admitting: Family

## 2018-11-26 NOTE — Telephone Encounter (Signed)
Left message to please call our office. Did provider discuss change in medication or did insurance deny payment on it?

## 2018-11-26 NOTE — Telephone Encounter (Signed)
What is the name of the medication? pantoprazole (PROTONIX) 40 MG tablet  Have you contacted your pharmacy to request a refill? Yes but refused. Pt states that this med should not be discontinued. Please call back  Which pharmacy would you like this sent to? [facility]   Patient notified that their request is being sent to the clinical staff for review and that they should receive a call once it is complete. If they do not receive a call within 24 hours they can check with their pharmacy or our office.

## 2018-11-27 ENCOUNTER — Other Ambulatory Visit: Payer: Self-pay | Admitting: *Deleted

## 2018-11-27 ENCOUNTER — Other Ambulatory Visit: Payer: Self-pay | Admitting: Family

## 2018-11-27 DIAGNOSIS — K219 Gastro-esophageal reflux disease without esophagitis: Secondary | ICD-10-CM

## 2018-11-27 MED ORDER — PANTOPRAZOLE SODIUM 40 MG PO TBEC: 40.0000 mg | DELAYED_RELEASE_TABLET | Freq: Every day | ORAL | 1 refills | Status: DC

## 2018-11-27 NOTE — Telephone Encounter (Signed)
Aware.  Quantity of 30 was sent to [facility] on 10-21-18.

## 2018-11-27 NOTE — Telephone Encounter (Signed)
It was written for 90 days. It needs to be written for 30 day.

## 2018-11-27 NOTE — Telephone Encounter (Signed)
Phone call to patient to make sure she is not taking Protonix and Dexilant, as both medication are on her medicine list.  She states she is only taking the Protonix.  Advised her to make sure to let Evelina Dun, FNP know at next visit so Dexliant can be removed from med list.

## 2018-12-20 ENCOUNTER — Telehealth: Payer: Self-pay | Admitting: Family

## 2019-01-23 ENCOUNTER — Other Ambulatory Visit: Payer: Self-pay

## 2019-01-24 ENCOUNTER — Ambulatory Visit (INDEPENDENT_AMBULATORY_CARE_PROVIDER_SITE_OTHER): Payer: 59 | Admitting: Family

## 2019-01-24 ENCOUNTER — Encounter: Payer: Self-pay | Admitting: Family

## 2019-01-24 DIAGNOSIS — F1320 Sedative, hypnotic or anxiolytic dependence, uncomplicated: Secondary | ICD-10-CM

## 2019-01-24 DIAGNOSIS — E785 Hyperlipidemia, unspecified: Secondary | ICD-10-CM

## 2019-01-24 DIAGNOSIS — J4520 Mild intermittent asthma, uncomplicated: Secondary | ICD-10-CM

## 2019-01-24 DIAGNOSIS — Z79899 Other long term (current) drug therapy: Secondary | ICD-10-CM

## 2019-01-24 DIAGNOSIS — I480 Paroxysmal atrial fibrillation: Secondary | ICD-10-CM

## 2019-01-24 DIAGNOSIS — E669 Obesity, unspecified: Secondary | ICD-10-CM

## 2019-01-24 DIAGNOSIS — F331 Major depressive disorder, recurrent, moderate: Secondary | ICD-10-CM

## 2019-01-24 DIAGNOSIS — K219 Gastro-esophageal reflux disease without esophagitis: Secondary | ICD-10-CM

## 2019-01-24 DIAGNOSIS — F419 Anxiety disorder, unspecified: Secondary | ICD-10-CM

## 2019-01-24 DIAGNOSIS — I10 Essential (primary) hypertension: Secondary | ICD-10-CM

## 2019-01-24 MED ORDER — APIXABAN 5 MG PO TABS: 5.0000 mg | ORAL_TABLET | Freq: Two times a day (BID) | ORAL | 1 refills | Status: DC

## 2019-01-24 MED ORDER — VENLAFAXINE HCL ER 150 MG PO CP24: 150.0000 mg | ORAL_CAPSULE | Freq: Every day | ORAL | 1 refills | Status: DC

## 2019-01-24 MED ORDER — SIMVASTATIN 40 MG PO TABS: ORAL_TABLET | ORAL | 1 refills | Status: DC

## 2019-01-24 MED ORDER — ALBUTEROL SULFATE HFA 108 (90 BASE) MCG/ACT IN AERS: 2.0000 | INHALATION_SPRAY | Freq: Four times a day (QID) | RESPIRATORY_TRACT | 1 refills | Status: DC | PRN

## 2019-01-24 MED ORDER — CLONAZEPAM 1 MG PO TABS: 1.0000 mg | ORAL_TABLET | Freq: Two times a day (BID) | ORAL | 5 refills | Status: DC

## 2019-01-24 NOTE — Progress Notes (Signed)
Virtual Visit via telephone Note  I connected with Natalie Cobb on 01/24/19 at 10:20 AM by telephone and verified that I am speaking with the correct person using two identifiers. Natalie Cobb is currently located at home and no one is currently with her during visit. The provider, Evelina Dun, FNP is located in their office at time of visit.  I discussed the limitations, risks, security and privacy concerns of performing an evaluation and management service by telephone and the availability of in person appointments. I also discussed with the patient that there may be a patient responsible charge related to this service. The patient expressed understanding and agreed to proceed.   History and Present Illness:  PT presents to the office for chronic follow up. PT has A Fib and taking Eliquis daily. Hypertension This is a chronic problem. The current episode started more than 1 year ago. The problem has been waxing and waning since onset. The problem is controlled. Associated symptoms include anxiety, malaise/fatigue and peripheral edema. Pertinent negatives include no headaches or shortness of breath. Sweats: "some times" Risk factors for coronary artery disease include dyslipidemia, obesity and sedentary lifestyle. The current treatment provides moderate improvement. There is no history of CAD/MI or heart failure.  Asthma She complains of cough. There is no shortness of breath or wheezing. This is a chronic problem. The current episode started more than 1 year ago. The problem occurs intermittently. The problem has been waxing and waning. Associated symptoms include heartburn and malaise/fatigue. Pertinent negatives include no headaches. Sweats: "some times" She reports moderate improvement on treatment. Her symptoms are not alleviated by leukotriene antagonist and ipratropium. Her past medical history is significant for asthma.  Gastroesophageal Reflux She complains of belching, coughing  and heartburn. She reports no wheezing. This is a chronic problem. The current episode started more than 1 year ago. The problem occurs occasionally. The problem has been waxing and waning. Risk factors include obesity. She has tried a PPI for the symptoms. The treatment provided moderate relief.  Hyperlipidemia This is a chronic problem. The current episode started more than 1 year ago. The problem is uncontrolled. Recent lipid tests were reviewed and are high. Exacerbating diseases include obesity. Pertinent negatives include no shortness of breath. Current antihyperlipidemic treatment includes statins. The current treatment provides moderate improvement of lipids. Risk factors for coronary artery disease include dyslipidemia, hypertension, a sedentary lifestyle and post-menopausal.  Depression        This is a chronic problem.  The current episode started more than 1 year ago.   The onset quality is gradual.   The problem occurs intermittently.  The problem has been waxing and waning since onset.  Associated symptoms include irritable, restlessness, decreased interest and sad.  Associated symptoms include no helplessness, no hopelessness and no headaches.  Past treatments include SNRIs - Serotonin and norepinephrine reuptake inhibitors.  Past medical history includes anxiety.   Anxiety Presents for follow-up visit. Symptoms include excessive worry, nervous/anxious behavior and restlessness. Patient reports no shortness of breath. Symptoms occur most days. The severity of symptoms is moderate. The quality of sleep is good.   Her past medical history is significant for asthma.      Review of Systems  Constitutional: Positive for malaise/fatigue.  Respiratory: Positive for cough. Negative for shortness of breath and wheezing.   Gastrointestinal: Positive for heartburn.  Neurological: Negative for headaches.  Psychiatric/Behavioral: Positive for depression. The patient is nervous/anxious.   All  other systems reviewed and  are negative.    Observations/Objective: No SOB or distress noted  Assessment and Plan: Natalie Cobb comes in today with chief complaint of No chief complaint on file.   Diagnosis and orders addressed:  1. Mild intermittent asthma without complication - albuterol (PROAIR HFA) 108 (90 Base) MCG/ACT inhaler; Inhale 2 puffs into the lungs every 6 (six) hours as needed for wheezing or shortness of breath.  Dispense: 6.7 g; Refill: 1 - venlafaxine XR (EFFEXOR-XR) 150 MG 24 hr capsule; Take 1 capsule (150 mg total) by mouth daily with breakfast.  Dispense: 90 capsule; Refill: 1  2. PAF (paroxysmal atrial fibrillation) (HCC) - apixaban (ELIQUIS) 5 MG TABS tablet; Take 1 tablet (5 mg total) by mouth 2 (two) times daily.  Dispense: 180 tablet; Refill: 1  3. Essential hypertension  4. Gastroesophageal reflux disease without esophagitis  5. Obesity (BMI 30-39.9)  6. Hyperlipidemia, unspecified hyperlipidemia type - simvastatin (ZOCOR) 40 MG tablet; TAKE 1 TABLET BY MOUTH EVERY DAY IN THE EVENING  Dispense: 90 tablet; Refill: 1  7. Moderate episode of recurrent major depressive disorder (HCC) - venlafaxine XR (EFFEXOR-XR) 150 MG 24 hr capsule; Take 1 capsule (150 mg total) by mouth daily with breakfast.  Dispense: 90 capsule; Refill: 1  8. Controlled substance agreement signed  9. Anxiety - clonazePAM (KLONOPIN) 1 MG tablet; Take 1 tablet (1 mg total) by mouth 2 (two) times daily.  Dispense: 60 tablet; Refill: 5 - venlafaxine XR (EFFEXOR-XR) 150 MG 24 hr capsule; Take 1 capsule (150 mg total) by mouth daily with breakfast.  Dispense: 90 capsule; Refill: 1  10. Benzodiazepine dependence (HCC) - clonazePAM (KLONOPIN) 1 MG tablet; Take 1 tablet (1 mg total) by mouth 2 (two) times daily.  Dispense: 60 tablet; Refill: 5   Labs reviewed  Pt reviewed in [place] controlled database- No red flags noted  Health Maintenance reviewed Diet and exercise encouraged  Follow  up plan: 4 months     I discussed the assessment and treatment plan with the patient. The patient was provided an opportunity to ask questions and all were answered. The patient agreed with the plan and demonstrated an understanding of the instructions.   The patient was advised to call back or seek an in-person evaluation if the symptoms worsen or if the condition fails to improve as anticipated.  The above assessment and management plan was discussed with the patient. The patient verbalized understanding of and has agreed to the management plan. Patient is aware to call the clinic if symptoms persist or worsen. Patient is aware when to return to the clinic for a follow-up visit. Patient educated on when it is appropriate to go to the emergency department.   Time call ended:  10:38 AM  I provided 18 minutes of non-face-to-face time during this encounter.    Evelina Dun, FNP

## 2019-01-25 ENCOUNTER — Other Ambulatory Visit: Payer: Self-pay | Admitting: Family

## 2019-02-04 ENCOUNTER — Encounter: Payer: Self-pay | Admitting: Nurse Practitioner

## 2019-02-04 ENCOUNTER — Ambulatory Visit (INDEPENDENT_AMBULATORY_CARE_PROVIDER_SITE_OTHER): Payer: Self-pay | Admitting: Nurse Practitioner

## 2019-02-04 ENCOUNTER — Other Ambulatory Visit: Payer: Self-pay

## 2019-02-04 DIAGNOSIS — M545 Low back pain, unspecified: Secondary | ICD-10-CM

## 2019-02-04 MED ORDER — PREDNISONE 20 MG PO TABS: ORAL_TABLET | ORAL | 0 refills | Status: DC

## 2019-02-04 MED ORDER — CYCLOBENZAPRINE HCL 10 MG PO TABS: 10.0000 mg | ORAL_TABLET | Freq: Three times a day (TID) | ORAL | 1 refills | Status: DC | PRN

## 2019-02-04 NOTE — Progress Notes (Signed)
Patient ID: Natalie Cobb, female   DOB: 1961/04/13, 58 y.o.   MRN: [id number]    Virtual Visit via telephone Note  I connected with Natalie Cobb on 02/04/19 at 9:55 by telephone and verified that I am speaking with the correct person using two identifiers. Natalie Cobb is currently located at home and no one is currently with her during visit. The provider, Mary-Margaret Hassell Done, FNP is located in their office at time of visit.  I discussed the limitations, risks, security and privacy concerns of performing an evaluation and management service by telephone and the availability of in person appointments. I also discussed with the patient that there may be a patient responsible charge related to this service. The patient expressed understanding and agreed to proceed.   History and Present Illness:   Chief Complaint: Back Pain   HPI Patient calls in today c/o low back pain. She went horse back riding lesson last Wednesday and her back has been hurtung her every since. Sh ehas been using ice and tried to stay still. Rates pain 8/10 currently. Activity increases pain. Rest and ice help some. Pain stays in lower back  And does not radiate. Has history of scoliosi.   Review of Systems  Respiratory: Negative.   Cardiovascular: Negative.   Genitourinary: Negative.   Musculoskeletal: Positive for back pain.  Neurological: Negative.   Psychiatric/Behavioral: Negative.   All other systems reviewed and are negative.    Observations/Objective: Alert and oriented- Mild distress  Assessment and Plan: Natalie Cobb in today with chief complaint of Back Pain   1. Acute midline low back pain without sciatica Moist heat Rest salpona patches OTC Meds ordered this encounter  Medications  . predniSONE (DELTASONE) 20 MG tablet    Sig: 2 po at sametime daily for 5 days    Dispense:  10 tablet    Refill:  0    Order Specific Question:   Supervising Provider    Answer:   Caryl Pina  A [id number]  . cyclobenzaprine (FLEXERIL) 10 MG tablet    Sig: Take 1 tablet (10 mg total) by mouth 3 (three) times daily as needed for muscle spasms.    Dispense:  30 tablet    Refill:  1    Order Specific Question:   Supervising Provider    Answer:   Caryl Pina A [[id number]]       Follow Up Instructions: prn    I discussed the assessment and treatment plan with the patient. The patient was provided an opportunity to ask questions and all were answered. The patient agreed with the plan and demonstrated an understanding of the instructions.   The patient was advised to call back or seek an in-person evaluation if the symptoms worsen or if the condition fails to improve as anticipated.  The above assessment and management plan was discussed with the patient. The patient verbalized understanding of and has agreed to the management plan. Patient is aware to call the clinic if symptoms persist or worsen. Patient is aware when to return to the clinic for a follow-up visit. Patient educated on when it is appropriate to go to the emergency department.   Time call ended:  10:05  I provided 10 minutes of non-face-to-face time during this encounter.    Mary-Margaret Hassell Done, FNP

## 2019-02-21 ENCOUNTER — Telehealth: Payer: Self-pay | Admitting: Family

## 2019-02-21 NOTE — Telephone Encounter (Signed)
No samples available.  Please advise.

## 2019-02-21 NOTE — Telephone Encounter (Signed)
She can go online and try to get coupon. Can we give her the number for [facility] Department for help with medications also.

## 2019-02-21 NOTE — Telephone Encounter (Signed)
Advised to call the Health Department prescription Assistance program.   Try contacting  the pharmaceutical company that makes the product for assistance also.

## 2019-02-21 NOTE — Telephone Encounter (Signed)
Aware.  No samples available or coupons.

## 2019-03-16 ENCOUNTER — Other Ambulatory Visit: Payer: Self-pay | Admitting: Family

## 2019-03-16 DIAGNOSIS — J4520 Mild intermittent asthma, uncomplicated: Secondary | ICD-10-CM

## 2019-03-16 DIAGNOSIS — F331 Major depressive disorder, recurrent, moderate: Secondary | ICD-10-CM

## 2019-03-16 DIAGNOSIS — F419 Anxiety disorder, unspecified: Secondary | ICD-10-CM

## 2019-03-18 ENCOUNTER — Other Ambulatory Visit: Payer: Self-pay | Admitting: Family

## 2019-03-18 DIAGNOSIS — I10 Essential (primary) hypertension: Secondary | ICD-10-CM

## 2019-03-18 DIAGNOSIS — I480 Paroxysmal atrial fibrillation: Secondary | ICD-10-CM

## 2019-04-11 ENCOUNTER — Other Ambulatory Visit: Payer: Self-pay | Admitting: Family

## 2019-04-11 DIAGNOSIS — J4520 Mild intermittent asthma, uncomplicated: Secondary | ICD-10-CM

## 2019-04-24 ENCOUNTER — Encounter: Payer: Self-pay | Admitting: Family

## 2019-04-24 ENCOUNTER — Telehealth: Payer: Self-pay | Admitting: Family

## 2019-04-24 ENCOUNTER — Ambulatory Visit (INDEPENDENT_AMBULATORY_CARE_PROVIDER_SITE_OTHER): Payer: Self-pay | Admitting: Family

## 2019-04-24 ENCOUNTER — Other Ambulatory Visit: Payer: Self-pay

## 2019-04-24 NOTE — Telephone Encounter (Signed)
Televisit appt made.  Patient complains dizziness with movement, some dyspnea on exertion and fatigue.  Will check BP while picking up medication.

## 2019-04-24 NOTE — Progress Notes (Signed)
   Virtual Visit via telephone Note Due to COVID-19 pandemic this visit was conducted virtually. This visit type was conducted due to national recommendations for restrictions regarding the COVID-19 Pandemic (e.g. social distancing, sheltering in place) in an effort to limit this patient's exposure and mitigate transmission in our community. All issues noted in this document were discussed and addressed.  A physical exam was not performed with this format.  Attempted to call patient at 3:50 pm, no answer. VM left. Attempted to call patient at 3:55 pm, no answer.  Attempted to call patient 4:07 pm, no answer. Will need to reschedule.    Evelina Dun, FNP

## 2019-05-01 ENCOUNTER — Other Ambulatory Visit: Payer: Self-pay | Admitting: Family

## 2019-05-01 DIAGNOSIS — E785 Hyperlipidemia, unspecified: Secondary | ICD-10-CM

## 2019-05-01 DIAGNOSIS — F419 Anxiety disorder, unspecified: Secondary | ICD-10-CM

## 2019-05-01 DIAGNOSIS — F331 Major depressive disorder, recurrent, moderate: Secondary | ICD-10-CM

## 2019-05-01 DIAGNOSIS — J4520 Mild intermittent asthma, uncomplicated: Secondary | ICD-10-CM

## 2019-05-27 ENCOUNTER — Ambulatory Visit: Payer: Self-pay | Admitting: Family

## 2019-06-12 ENCOUNTER — Ambulatory Visit: Payer: Self-pay | Admitting: Family

## 2019-06-30 ENCOUNTER — Other Ambulatory Visit: Payer: Self-pay

## 2019-07-01 ENCOUNTER — Ambulatory Visit (INDEPENDENT_AMBULATORY_CARE_PROVIDER_SITE_OTHER): Payer: 59 | Admitting: Family

## 2019-07-01 ENCOUNTER — Encounter: Payer: Self-pay | Admitting: Family

## 2019-07-01 DIAGNOSIS — I480 Paroxysmal atrial fibrillation: Secondary | ICD-10-CM

## 2019-07-01 DIAGNOSIS — K219 Gastro-esophageal reflux disease without esophagitis: Secondary | ICD-10-CM

## 2019-07-01 DIAGNOSIS — F331 Major depressive disorder, recurrent, moderate: Secondary | ICD-10-CM

## 2019-07-01 DIAGNOSIS — R5383 Other fatigue: Secondary | ICD-10-CM

## 2019-07-01 DIAGNOSIS — I10 Essential (primary) hypertension: Secondary | ICD-10-CM

## 2019-07-01 DIAGNOSIS — F419 Anxiety disorder, unspecified: Secondary | ICD-10-CM

## 2019-07-01 DIAGNOSIS — E785 Hyperlipidemia, unspecified: Secondary | ICD-10-CM

## 2019-07-01 DIAGNOSIS — F1320 Sedative, hypnotic or anxiolytic dependence, uncomplicated: Secondary | ICD-10-CM

## 2019-07-01 DIAGNOSIS — E669 Obesity, unspecified: Secondary | ICD-10-CM

## 2019-07-01 DIAGNOSIS — Z905 Acquired absence of kidney: Secondary | ICD-10-CM

## 2019-07-01 DIAGNOSIS — R350 Frequency of micturition: Secondary | ICD-10-CM

## 2019-07-01 DIAGNOSIS — D75838 Other thrombocytosis: Secondary | ICD-10-CM

## 2019-07-01 DIAGNOSIS — D473 Essential (hemorrhagic) thrombocythemia: Secondary | ICD-10-CM

## 2019-07-01 DIAGNOSIS — Z79899 Other long term (current) drug therapy: Secondary | ICD-10-CM

## 2019-07-01 DIAGNOSIS — Z9081 Acquired absence of spleen: Secondary | ICD-10-CM

## 2019-07-01 DIAGNOSIS — J4520 Mild intermittent asthma, uncomplicated: Secondary | ICD-10-CM

## 2019-07-01 MED ORDER — METOPROLOL TARTRATE 50 MG PO TABS: 50.0000 mg | ORAL_TABLET | Freq: Two times a day (BID) | ORAL | 3 refills | Status: DC

## 2019-07-01 MED ORDER — PANTOPRAZOLE SODIUM 40 MG PO TBEC: 40.0000 mg | DELAYED_RELEASE_TABLET | Freq: Every day | ORAL | 3 refills | Status: DC

## 2019-07-01 MED ORDER — SIMVASTATIN 40 MG PO TABS: 40.0000 mg | ORAL_TABLET | Freq: Every day | ORAL | 3 refills | Status: DC

## 2019-07-01 MED ORDER — MONTELUKAST SODIUM 10 MG PO TABS: ORAL_TABLET | ORAL | 2 refills | Status: DC

## 2019-07-01 MED ORDER — CLONAZEPAM 1 MG PO TABS: 1.0000 mg | ORAL_TABLET | Freq: Two times a day (BID) | ORAL | 5 refills | Status: DC

## 2019-07-01 MED ORDER — APIXABAN 5 MG PO TABS: 5.0000 mg | ORAL_TABLET | Freq: Two times a day (BID) | ORAL | 1 refills | Status: DC

## 2019-07-01 MED ORDER — VENLAFAXINE HCL ER 150 MG PO CP24: 150.0000 mg | ORAL_CAPSULE | Freq: Every day | ORAL | 1 refills | Status: DC

## 2019-07-01 NOTE — Progress Notes (Signed)
Virtual Visit via telephone Note Due to COVID-19 pandemic this visit was conducted virtually. This visit type was conducted due to national recommendations for restrictions regarding the COVID-19 Pandemic (e.g. social distancing, sheltering in place) in an effort to limit this patient's exposure and mitigate transmission in our community. All issues noted in this document were discussed and addressed.  A physical exam was not performed with this format.  I connected with Natalie Cobb on 07/01/19 at 12:50 pm by telephone and verified that I am speaking with the correct person using two identifiers. Natalie Cobb is currently located at home and no one is currently with her during visit. The provider, Evelina Dun, FNP is located in their office at time of visit.  I discussed the limitations, risks, security and privacy concerns of performing an evaluation and management service by telephone and the availability of in person appointments. I also discussed with the patient that there may be a patient responsible charge related to this service. The patient expressed understanding and agreed to proceed.   History and Present Illness:  PT calls the office for chronic follow up. PT has A Fib and taking Eliquis daily.She is complaining of increased fatigue and sleepiness over the last few months.  Hypertension This is a chronic problem. The current episode started more than 1 year ago. The problem has been resolved since onset. The problem is controlled. Associated symptoms include anxiety and peripheral edema ("some times"). Pertinent negatives include no malaise/fatigue or shortness of breath. Risk factors for coronary artery disease include dyslipidemia, obesity and sedentary lifestyle. The current treatment provides moderate improvement. There is no history of kidney disease, CVA or heart failure.  Asthma She complains of frequent throat clearing and hoarse voice. There is no cough, shortness of  breath or wheezing. This is a chronic problem. The current episode started more than 1 year ago. The problem occurs intermittently. The problem has been waxing and waning. Associated symptoms include nasal congestion and rhinorrhea. Pertinent negatives include no ear congestion, fever or malaise/fatigue. She reports moderate improvement on treatment. Her past medical history is significant for asthma.  Gastroesophageal Reflux She complains of belching and a hoarse voice. She reports no coughing, no nausea or no wheezing. This is a chronic problem. The current episode started more than 1 year ago. The problem occurs occasionally. The problem has been waxing and waning. The symptoms are aggravated by certain foods. Risk factors include obesity. She has tried a PPI for the symptoms. The treatment provided moderate relief.  Hyperlipidemia This is a chronic problem. The current episode started more than 1 year ago. The problem is uncontrolled. Recent lipid tests were reviewed and are high. Exacerbating diseases include obesity. Pertinent negatives include no shortness of breath. Current antihyperlipidemic treatment includes statins. The current treatment provides moderate improvement of lipids. Risk factors for coronary artery disease include dyslipidemia, hypertension, a sedentary lifestyle and post-menopausal.  Anxiety Presents for follow-up visit. Symptoms include decreased concentration, excessive worry, irritability, nervous/anxious behavior, panic and restlessness. Patient reports no nausea or shortness of breath. Symptoms occur occasionally. The severity of symptoms is moderate.   Her past medical history is significant for asthma.  Depression        This is a chronic problem.  The current episode started more than 1 year ago.   The onset quality is gradual.   The problem occurs intermittently.  The problem has been waxing and waning since onset.  Associated symptoms include decreased concentration,  irritable,  restlessness and sad.  Associated symptoms include no helplessness and no hopelessness.     The symptoms are aggravated by family issues and work stress.  Past treatments include SNRIs - Serotonin and norepinephrine reuptake inhibitors.  Past medical history includes anxiety.   Urinary Frequency  This is a recurrent problem. The current episode started more than 1 month ago. The problem occurs every urination. The problem has been waxing and waning. The pain is at a severity of 0/10. Associated symptoms include frequency. Pertinent negatives include no hematuria, hesitancy or nausea. She has tried nothing for the symptoms. The treatment provided no relief.      Review of Systems  Constitutional: Positive for irritability. Negative for fever and malaise/fatigue.  HENT: Positive for hoarse voice and rhinorrhea.   Respiratory: Negative for cough, shortness of breath and wheezing.   Gastrointestinal: Negative for nausea.  Genitourinary: Positive for frequency. Negative for hematuria and hesitancy.  Psychiatric/Behavioral: Positive for decreased concentration and depression. The patient is nervous/anxious.      Observations/Objective: No SOB or distress noted   Assessment and Plan: Natalie Cobb comes in today with chief complaint of No chief complaint on file.   Diagnosis and orders addressed:  1. PAF (paroxysmal atrial fibrillation) (HCC) - CMP14+EGFR - metoprolol tartrate (LOPRESSOR) 50 MG tablet; Take 1 tablet (50 mg total) by mouth 2 (two) times daily.  Dispense: 180 tablet; Refill: 3 - apixaban (ELIQUIS) 5 MG TABS tablet; Take 1 tablet (5 mg total) by mouth 2 (two) times daily.  Dispense: 180 tablet; Refill: 1  2. Essential hypertension - CMP14+EGFR - metoprolol tartrate (LOPRESSOR) 50 MG tablet; Take 1 tablet (50 mg total) by mouth 2 (two) times daily.  Dispense: 180 tablet; Refill: 3  3. Mild intermittent asthma without complication - SHF02+OVZC - montelukast  (SINGULAIR) 10 MG tablet; TAKE 1 TABLET BY MOUTH EVERYDAY AT BEDTIME  Dispense: 90 tablet; Refill: 2  4. Gastroesophageal reflux disease without esophagitis - CMP14+EGFR - pantoprazole (PROTONIX) 40 MG tablet; Take 1 tablet (40 mg total) by mouth daily.  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 Anxiety - CMP14+EGFR - clonazePAM (KLONOPIN) 1 MG tablet; Take 1 tablet (1 mg total) by mouth 2 (two) times daily.  Dispense: 60 tablet; Refill: 5 - venlafaxine XR (EFFEXOR-XR) 150 MG 24 hr capsule; Take 1 capsule (150 mg total) by mouth daily with breakfast.  Dispense: 90 capsule; Refill: 1  7. Controlled substance agreement signed - CMP14+EGFR - DRUG SCREEN-TOXASSURE - clonazePAM (KLONOPIN) 1 MG tablet; Take 1 tablet (1 mg total) by mouth 2 (two) times daily.  Dispense: 60 tablet; Refill: 5  8. Benzodiazepine dependence (HCC) - CMP14+EGFR - DRUG SCREEN-TOXASSURE - clonazePAM (KLONOPIN) 1 MG tablet; Take 1 tablet (1 mg total) by mouth 2 (two) times daily.  Dispense: 60 tablet; Refill: 5  9. Obesity (BMI 30-39.9) - CMP14+EGFR  10. H/O kidney removal - CMP14+EGFR  11. Moderate episode of recurrent major depressive disorder (HCC) - CMP14+EGFR - venlafaxine XR (EFFEXOR-XR) 150 MG 24 hr capsule; Take 1 capsule (150 mg total) by mouth daily with breakfast.  Dispense: 90 capsule; Refill: 1  12. Hyperlipidemia, unspecified hyperlipidemia type - CMP14+EGFR - Lipid panel - simvastatin (ZOCOR) 40 MG tablet; Take 1 tablet (40 mg total) by mouth daily at 6 PM.  Dispense: 90 tablet; Refill: 3  13. Fatigue, unspecified type - CMP14+EGFR - Anemia Profile with CBC - TSH  14. Urinary frequency - CMP14+EGFR - urinalysis- dip  and micro   Labs pending Health Maintenance reviewed Diet and exercise encouraged  Follow up plan: 6 months       I discussed the assessment and treatment plan with the patient. The patient was provided an  opportunity to ask questions and all were answered. The patient agreed with the plan and demonstrated an understanding of the instructions.   The patient was advised to call back or seek an in-person evaluation if the symptoms worsen or if the condition fails to improve as anticipated.  The above assessment and management plan was discussed with the patient. The patient verbalized understanding of and has agreed to the management plan. Patient is aware to call the clinic if symptoms persist or worsen. Patient is aware when to return to the clinic for a follow-up visit. Patient educated on when it is appropriate to go to the emergency department.   Time call ended:  1:16 pm   I provided 26 minutes of non-face-to-face time during this encounter.    Evelina Dun, FNP

## 2019-07-02 ENCOUNTER — Other Ambulatory Visit: Payer: Self-pay

## 2019-07-02 ENCOUNTER — Other Ambulatory Visit: Payer: 59

## 2019-07-03 LAB — LIPID PANEL
Chol/HDL Ratio: 4.3 ratio (ref 0.0–4.4)
Cholesterol, Total: 186 mg/dL (ref 100–199)
HDL: 43 mg/dL (ref >39)
LDL Chol Calc (NIH): 112 mg/dL — ABNORMAL HIGH (ref 0–99)
Triglycerides: 178 mg/dL — ABNORMAL HIGH (ref 0–149)
VLDL Cholesterol Cal: 31 mg/dL (ref 5–40)

## 2019-07-03 LAB — ANEMIA PROFILE B
Basophils Absolute: 0.1 10*3/uL (ref 0.0–0.2)
Basos: 1 %
EOS (ABSOLUTE): 0.3 10*3/uL (ref 0.0–0.4)
Eos: 3 %
Ferritin: 108 ng/mL (ref 15–150)
Folate: 4.9 ng/mL (ref >3.0)
Hematocrit: 45.4 % (ref 34.0–46.6)
Hemoglobin: 14.6 g/dL (ref 11.1–15.9)
Immature Grans (Abs): 0 10*3/uL (ref 0.0–0.1)
Immature Granulocytes: 0 %
Iron Saturation: 20 % (ref 15–55)
Iron: 73 ug/dL (ref 27–159)
Lymphocytes Absolute: 6.6 10*3/uL — ABNORMAL HIGH (ref 0.7–3.1)
Lymphs: 49 %
MCH: 30.2 pg (ref 26.6–33.0)
MCHC: 32.2 g/dL (ref 31.5–35.7)
MCV: 94 fL (ref 79–97)
Monocytes Absolute: 0.9 10*3/uL (ref 0.1–0.9)
Monocytes: 7 %
Neutrophils Absolute: 5.4 10*3/uL (ref 1.4–7.0)
Neutrophils: 40 %
Platelets: 449 10*3/uL (ref 150–450)
RBC: 4.84 x10E6/uL (ref 3.77–5.28)
RDW: 12.5 % (ref 11.7–15.4)
Retic Ct Pct: 2.3 % (ref 0.6–2.6)
Total Iron Binding Capacity: 367 ug/dL (ref 250–450)
UIBC: 294 ug/dL (ref 131–425)
Vitamin B-12: 475 pg/mL (ref 232–1245)
WBC: 13.3 10*3/uL — ABNORMAL HIGH (ref 3.4–10.8)

## 2019-07-03 LAB — CMP14+EGFR
ALT: 30 IU/L (ref 0–32)
AST: 29 IU/L (ref 0–40)
Albumin/Globulin Ratio: 1.4 (ref 1.2–2.2)
Albumin: 4.2 g/dL (ref 3.8–4.9)
Alkaline Phosphatase: 110 IU/L (ref 39–117)
BUN/Creatinine Ratio: 20 (ref 9–23)
BUN: 17 mg/dL (ref 6–24)
Bilirubin Total: 0.7 mg/dL (ref 0.0–1.2)
CO2: 25 mmol/L (ref 20–29)
Calcium: 10.2 mg/dL (ref 8.7–10.2)
Chloride: 102 mmol/L (ref 96–106)
Creatinine, Ser: 0.85 mg/dL (ref 0.57–1.00)
GFR calc Af Amer: 87 mL/min/{1.73_m2} (ref >59)
GFR calc non Af Amer: 76 mL/min/{1.73_m2} (ref >59)
Globulin, Total: 3.1 g/dL (ref 1.5–4.5)
Glucose: 114 mg/dL — ABNORMAL HIGH (ref 65–99)
Potassium: 5.1 mmol/L (ref 3.5–5.2)
Sodium: 140 mmol/L (ref 134–144)
Total Protein: 7.3 g/dL (ref 6.0–8.5)

## 2019-07-03 LAB — TSH: TSH: 1.64 u[IU]/mL (ref 0.450–4.500)

## 2019-07-07 ENCOUNTER — Other Ambulatory Visit: Payer: Self-pay | Admitting: Family

## 2019-07-07 DIAGNOSIS — D72829 Elevated white blood cell count, unspecified: Secondary | ICD-10-CM

## 2019-07-07 NOTE — Progress Notes (Signed)
Left message to please call our office. 

## 2019-07-09 ENCOUNTER — Ambulatory Visit (HOSPITAL_COMMUNITY): Payer: 59 | Admitting: Hematology

## 2019-07-15 ENCOUNTER — Ambulatory Visit (INDEPENDENT_AMBULATORY_CARE_PROVIDER_SITE_OTHER): Payer: 59 | Admitting: Family

## 2019-07-15 ENCOUNTER — Encounter: Payer: Self-pay | Admitting: Family

## 2019-07-15 ENCOUNTER — Telehealth: Payer: Self-pay | Admitting: Family

## 2019-07-15 DIAGNOSIS — D72829 Elevated white blood cell count, unspecified: Secondary | ICD-10-CM

## 2019-07-15 DIAGNOSIS — R509 Fever, unspecified: Secondary | ICD-10-CM

## 2019-07-15 NOTE — Progress Notes (Signed)
   Virtual Visit via telephone Note Due to COVID-19 pandemic this visit was conducted virtually. This visit type was conducted due to national recommendations for restrictions regarding the COVID-19 Pandemic (e.g. social distancing, sheltering in place) in an effort to limit this patient's exposure and mitigate transmission in our community. All issues noted in this document were discussed and addressed.  A physical exam was not performed with this format.  I connected with Natalie Cobb on 07/15/19 at 10:27 AM by telephone and verified that I am speaking with the correct person using two identifiers. [name] is currently located at home and no one is currently with him during visit. The provider, Evelina Dun, FNP is located in their office at time of visit.  I discussed the limitations, risks, security and privacy concerns of performing an evaluation and management service by telephone and the availability of in person appointments. I also discussed with the patient that there may be a patient responsible charge related to this service. The patient expressed understanding and agreed to proceed.   History and Present Illness:  PT calls the office questioning her elevated WBC. She was referred to Hematologists. She states she cancelled this appointment. However, she states she is having intermittent fevers. Discussed she needs to call Hematologists today and get appointment. Worrisome for some type of blood disorder given elevated WBC, intermittent fever, fatigue.   Evelina Dun, FNP

## 2019-07-17 ENCOUNTER — Other Ambulatory Visit: Payer: Self-pay

## 2019-07-17 ENCOUNTER — Inpatient Hospital Stay (HOSPITAL_COMMUNITY): Payer: 59 | Attending: Nurse Practitioner | Admitting: Nurse Practitioner

## 2019-07-17 DIAGNOSIS — D473 Essential (hemorrhagic) thrombocythemia: Secondary | ICD-10-CM

## 2019-07-17 DIAGNOSIS — Z9081 Acquired absence of spleen: Secondary | ICD-10-CM | POA: Diagnosis not present

## 2019-07-17 DIAGNOSIS — R7989 Other specified abnormal findings of blood chemistry: Secondary | ICD-10-CM | POA: Insufficient documentation

## 2019-07-17 DIAGNOSIS — Z905 Acquired absence of kidney: Secondary | ICD-10-CM | POA: Insufficient documentation

## 2019-07-17 DIAGNOSIS — I10 Essential (primary) hypertension: Secondary | ICD-10-CM | POA: Diagnosis not present

## 2019-07-17 DIAGNOSIS — D75838 Other thrombocytosis: Secondary | ICD-10-CM

## 2019-07-17 NOTE — Assessment & Plan Note (Addendum)
1.  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 -Right neck lymph node biopsy in 1992, no evidence of malignancy at [place]. -No aqua genic pruritus suggested of myeloproliferative disorder.  She does report having fevers and hot flashes. -BCR/ABL by FISH was negative.  JAK2 V617 and reflex testing were negative.  This essentially rules out any myeloproliferative disorders.  Her platelet count today is improved. -Labs done on 07/02/2019 showed WBC has slightly increased at 13.3.  Her platelet count has improved to 449.  Hemoglobin 14.6, HCT 45.4 -We will see her back in 2 months with repeat labs.

## 2019-07-17 NOTE — Progress Notes (Signed)
Natalie Cobb, [place] [address]   CLINIC:  Medical Oncology/Hematology  PCP:  Sharion Balloon, [place] [address] [phone]   REASON FOR VISIT: Follow-up for thrombocytosis  CURRENT THERAPY: Observation   INTERVAL HISTORY:  Natalie Cobb 58 y.o. female returns for routine follow-up for thrombocytosis.  Patient reports she has increased fevers and hot flashes.  She also reports joint pain.  She does report increased fatigue.  She denies any bright red bleeding per rectum or melena.  She denies any aquagenic pruritus.  Denies any new pains. Had not noticed any recent bleeding such as epistaxis, hematuria or hematochezia. Denies recent chest pain on exertion, shortness of breath on minimal exertion, pre-syncopal episodes, or palpitations. Denies any numbness or tingling in hands or feet. Denies any recent fevers, infections, or recent hospitalizations. Patient reports appetite at 50% and energy level at 0%.     REVIEW OF SYSTEMS:  Review of Systems  Constitutional: Positive for chills, fatigue and fever.  Cardiovascular: Positive for leg swelling.  Gastrointestinal: Positive for constipation, diarrhea and nausea.  Neurological: Positive for dizziness and headaches.  Psychiatric/Behavioral: Positive for sleep disturbance.  All other systems reviewed and are negative.    PAST MEDICAL/SURGICAL HISTORY:  Past Medical History:  Diagnosis Date  . Anxiety   . Asthma   . Depression   . Diverticulitis   . GERD (gastroesophageal reflux disease)   . Hernia of abdominal wall   . Hypertension   . IBS (irritable bowel syndrome)   . PAF (paroxysmal atrial fibrillation) ([place])   . Thrombocytosis ([place])    Splenectomy 1963  . 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 Left nephrectomy  1963  . LYMPH  GLAND EXCISION     neck in 20's  . ORIF ANKLE FRACTURE Right 10/18/2013   Procedure: OPEN REDUCTION INTERNAL FIXATION (ORIF) RIGHT TRIMALLEOLAR ANKLE FRACTURE;  Surgeon: Newt Minion, MD;  Location: [facility];  Service: Orthopedics;  Laterality: Right;  . REVISION OF SCAR     of abdominal scar age 62  . Splenectomy  1963  . TUBAL LIGATION    . WISDOM TOOTH EXTRACTION       SOCIAL HISTORY:  Social History   Socioeconomic History  . Marital status: Legally Separated    Spouse name: Not on file  . Number of children: Not on file  . Years of education: Not on file  . Highest education level: Not on file  Occupational History  . Not on file  Tobacco Use  . Smoking status: Former Smoker    Years: 10.00  . Smokeless tobacco: Never Used  Substance and Sexual Activity  . Alcohol use: Yes    Comment: rarely  . Drug use: Yes    Types: Marijuana  . Sexual activity: Not Currently    Birth control/protection: Surgical    Comment: Quit around 2010  Other Topics Concern  . Not on file  Social History Narrative  . Not on file   Social Determinants of Health   Financial Resource Strain:   . Difficulty of Paying Living Expenses: Not on file  Food Insecurity:   . Worried About Charity fundraiser in the Last Year: Not on file  . Ran Out of Food in the Last Year: Not on file  Transportation Needs:   .  Lack of Transportation (Medical): Not on file  . Lack of Transportation (Non-Medical): Not on file  Physical Activity:   . Days of Exercise per Week: Not on file  . Minutes of Exercise per Session: Not on file  Stress:   . Feeling of Stress : Not on file  Social Connections:   . Frequency of Communication with Friends and Family: Not on file  . Frequency of Social Gatherings with Friends and Family: Not on file  . Attends Religious Services: Not on file  . Active Member of Clubs or Organizations: Not on file  . Attends Archivist Meetings: Not on file  . Marital Status: Not  on file  Intimate Partner Violence:   . Fear of Current or Ex-Partner: Not on file  . Emotionally Abused: Not on file  . Physically Abused: Not on file  . Sexually Abused: Not on file    FAMILY HISTORY:  Family History  Problem Relation Age of Onset  . Stroke Mother   . Pulmonary fibrosis Father   . Brain cancer Paternal Grandmother     CURRENT MEDICATIONS:  Outpatient Encounter Medications as of 07/17/2019  Medication Sig  . apixaban (ELIQUIS) 5 MG TABS tablet Take 1 tablet (5 mg total) by mouth 2 (two) times daily.  . Cholecalciferol (VITAMIN D-3) 5000 UNITS TABS Take 5,000 Units by mouth every evening.   . clonazePAM (KLONOPIN) 1 MG tablet Take 1 tablet (1 mg total) by mouth 2 (two) times daily.  . fish oil-omega-3 fatty acids 1000 MG capsule Take 1 g by mouth every evening.   . metoprolol tartrate (LOPRESSOR) 50 MG tablet Take 1 tablet (50 mg total) by mouth 2 (two) times daily.  . montelukast (SINGULAIR) 10 MG tablet TAKE 1 TABLET BY MOUTH EVERYDAY AT BEDTIME  . pantoprazole (PROTONIX) 40 MG tablet Take 1 tablet (40 mg total) by mouth daily.  . simvastatin (ZOCOR) 40 MG tablet Take 1 tablet (40 mg total) by mouth daily at 6 PM.  . venlafaxine XR (EFFEXOR-XR) 150 MG 24 hr capsule Take 1 capsule (150 mg total) by mouth daily with breakfast.  . albuterol (PROAIR HFA) 108 (90 Base) MCG/ACT inhaler Inhale 2 puffs into the lungs every 6 (six) hours as needed for wheezing or shortness of breath. (Patient not taking: Reported on 07/17/2019)   No facility-administered encounter medications on file as of 07/17/2019.    ALLERGIES:  Allergies  Allergen Reactions  . Compazine [Prochlorperazine Edisylate] Other (See Comments)    Patient has "muscular" reaction to all medications that end in "ZINE"  . Meclizine     Patient has "muscular" reaction to all medications that end in "ZINE"  . Thorazine [Chlorpromazine] Other (See Comments)    Patient has "muscular" reaction to all  medications that end in "ZINE"  . Sulfa Antibiotics Rash     PHYSICAL EXAM:  ECOG Performance status: 1  Vitals:   07/17/19 1159  BP: 136/85  Pulse: 72  Resp: 18  Temp: 97.7 F (36.5 C)  SpO2: 95%   Filed Weights   07/17/19 1159  Weight: 198 lb (89.8 kg)    Physical Exam Constitutional:      Appearance: Normal appearance. She is normal weight.  Cardiovascular:     Rate and Rhythm: Normal rate and regular rhythm.     Heart sounds: Normal heart sounds.  Pulmonary:     Effort: Pulmonary effort is normal.     Breath sounds: Normal breath sounds.  Abdominal:  General: Bowel sounds are normal.     Palpations: Abdomen is soft.  Musculoskeletal:        General: Normal range of motion.  Skin:    General: Skin is warm.  Neurological:     Mental Status: She is alert and oriented to person, place, and time. Mental status is at baseline.  Psychiatric:        Mood and Affect: Mood normal.        Behavior: Behavior normal.        Thought Content: Thought content normal.        Judgment: Judgment normal.      LABORATORY DATA:  I have reviewed the labs as listed.  CBC    Component Value Date/Time   WBC 13.3 (H) 07/02/2019 0917   WBC 10.1 12/26/2017 1028   RBC 4.84 07/02/2019 0917   RBC 4.21 12/26/2017 1028   HGB 14.6 07/02/2019 0917   HCT 45.4 07/02/2019 0917   PLT 449 07/02/2019 0917   MCV 94 07/02/2019 0917   MCH 30.2 07/02/2019 0917   MCH 31.6 12/26/2017 1028   MCHC 32.2 07/02/2019 0917   MCHC 32.3 12/26/2017 1028   RDW 12.5 07/02/2019 0917   LYMPHSABS 6.6 (H) 07/02/2019 0917   MONOABS 0.9 12/26/2017 1028   EOSABS 0.3 07/02/2019 0917   BASOSABS 0.1 07/02/2019 0917   CMP Latest Ref Rng & Units 07/02/2019 07/22/2018 04/22/2018  Glucose 65 - 99 mg/dL 114(H) 142(H) 103(H)  BUN 6 - 24 mg/dL 17 19 17   Creatinine 0.57 - 1.00 mg/dL 0.85 0.86 0.77  Sodium 134 - 144 mmol/L 140 143 139  Potassium 3.5 - 5.2 mmol/L 5.1 5.0 4.9  Chloride 96 - 106 mmol/L 102 106 103   CO2 20 - 29 mmol/L 25 22 21   Calcium 8.7 - 10.2 mg/dL 10.2 9.6 10.5(H)  Total Protein 6.0 - 8.5 g/dL 7.3 7.3 7.4  Total Bilirubin 0.0 - 1.2 mg/dL 0.7 0.8 1.0  Alkaline Phos 39 - 117 IU/L 110 98 94  AST 0 - 40 IU/L 29 23 19   ALT 0 - 32 IU/L 30 19 16    I personally performed a face-to-face visit.  All questions were answered to patient's stated satisfaction. Encouraged patient to call with any new concerns or questions before his next visit to the cancer center and we can certain see him sooner, if needed.     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  Thrombocytosis: -Splenectomy and left nephrectomy at 12 months of age (1963) due to left kidney Wilms tumor, status post chemo and radiation therapy at [facility]. -Right neck lymph node biopsy in 1992, no evidence of malignancy at [place]. -No aqua genic pruritus suggested of myeloproliferative disorder.  She does report having fevers and hot flashes. -BCR/ABL by FISH was negative.  JAK2 V617 and reflex testing were negative.  This essentially rules out any myeloproliferative disorders.  Her platelet count today is improved. -Labs done on 07/02/2019 showed WBC has slightly increased at 13.3.  Her platelet count has improved to 449.  Hemoglobin 14.6, HCT 45.4 -We will see her back in 2 months with repeat labs.      Orders placed this encounter:  Orders Placed This Encounter  Procedures  . Flow Cytometry  . Lactate dehydrogenase  . CBC with Differential/Platelet  . Comprehensive metabolic panel  . ANA, IFA (with reflex)  . Rheumatoid factor      Francene Finders, FNP-C [facility] [phone]

## 2019-07-26 ENCOUNTER — Other Ambulatory Visit: Payer: Self-pay | Admitting: Family

## 2019-07-26 DIAGNOSIS — F419 Anxiety disorder, unspecified: Secondary | ICD-10-CM

## 2019-07-26 DIAGNOSIS — F1320 Sedative, hypnotic or anxiolytic dependence, uncomplicated: Secondary | ICD-10-CM

## 2019-07-26 DIAGNOSIS — Z79899 Other long term (current) drug therapy: Secondary | ICD-10-CM

## 2019-07-28 ENCOUNTER — Other Ambulatory Visit: Payer: Self-pay | Admitting: Family

## 2019-07-28 DIAGNOSIS — F419 Anxiety disorder, unspecified: Secondary | ICD-10-CM

## 2019-07-28 DIAGNOSIS — F1320 Sedative, hypnotic or anxiolytic dependence, uncomplicated: Secondary | ICD-10-CM

## 2019-07-28 DIAGNOSIS — Z79899 Other long term (current) drug therapy: Secondary | ICD-10-CM

## 2019-07-28 MED ORDER — CLONAZEPAM 1 MG PO TABS: 1.0000 mg | ORAL_TABLET | Freq: Two times a day (BID) | ORAL | 5 refills | Status: DC

## 2019-08-19 ENCOUNTER — Inpatient Hospital Stay (HOSPITAL_COMMUNITY): Payer: 59 | Attending: Hematology

## 2019-08-19 ENCOUNTER — Other Ambulatory Visit: Payer: Self-pay

## 2019-08-19 DIAGNOSIS — Z9081 Acquired absence of spleen: Secondary | ICD-10-CM | POA: Diagnosis not present

## 2019-08-19 DIAGNOSIS — R7989 Other specified abnormal findings of blood chemistry: Secondary | ICD-10-CM | POA: Diagnosis not present

## 2019-08-19 DIAGNOSIS — I10 Essential (primary) hypertension: Secondary | ICD-10-CM | POA: Insufficient documentation

## 2019-08-19 DIAGNOSIS — I480 Paroxysmal atrial fibrillation: Secondary | ICD-10-CM | POA: Insufficient documentation

## 2019-08-19 LAB — CBC WITH DIFFERENTIAL/PLATELET
Abs Immature Granulocytes: 0.04 10*3/uL (ref 0.00–0.07)
Basophils Absolute: 0.1 10*3/uL (ref 0.0–0.1)
Basophils Relative: 1 %
Eosinophils Absolute: 0.3 10*3/uL (ref 0.0–0.5)
Eosinophils Relative: 3 %
HCT: 46.2 % — ABNORMAL HIGH (ref 36.0–46.0)
Hemoglobin: 14.6 g/dL (ref 12.0–15.0)
Immature Granulocytes: 0 %
Lymphocytes Relative: 48 %
Lymphs Abs: 4.3 10*3/uL — ABNORMAL HIGH (ref 0.7–4.0)
MCH: 30.9 pg (ref 26.0–34.0)
MCHC: 31.6 g/dL (ref 30.0–36.0)
MCV: 97.7 fL (ref 80.0–100.0)
Monocytes Absolute: 0.8 10*3/uL (ref 0.1–1.0)
Monocytes Relative: 8 %
Neutro Abs: 3.6 10*3/uL (ref 1.7–7.7)
Neutrophils Relative %: 40 %
Platelets: 498 10*3/uL — ABNORMAL HIGH (ref 150–400)
RBC: 4.73 MIL/uL (ref 3.87–5.11)
RDW: 13.5 % (ref 11.5–15.5)
WBC: 9.1 10*3/uL (ref 4.0–10.5)
nRBC: 0 % (ref 0.0–0.2)

## 2019-08-19 LAB — COMPREHENSIVE METABOLIC PANEL
ALT: 27 U/L (ref 0–44)
AST: 27 U/L (ref 15–41)
Albumin: 3.7 g/dL (ref 3.5–5.0)
Alkaline Phosphatase: 88 U/L (ref 38–126)
Anion gap: 7 (ref 5–15)
BUN: 19 mg/dL (ref 6–20)
CO2: 26 mmol/L (ref 22–32)
Calcium: 9.4 mg/dL (ref 8.9–10.3)
Chloride: 103 mmol/L (ref 98–111)
Creatinine, Ser: 0.82 mg/dL (ref 0.44–1.00)
GFR calc Af Amer: >60 mL/min (ref >60)
GFR calc non Af Amer: >60 mL/min (ref >60)
Glucose, Bld: 108 mg/dL — ABNORMAL HIGH (ref 70–99)
Potassium: 5.3 mmol/L — ABNORMAL HIGH (ref 3.5–5.1)
Sodium: 136 mmol/L (ref 135–145)
Total Bilirubin: 1.2 mg/dL (ref 0.3–1.2)
Total Protein: 7.9 g/dL (ref 6.5–8.1)

## 2019-08-19 LAB — LACTATE DEHYDROGENASE: LDH: 141 U/L (ref 98–192)

## 2019-08-20 LAB — SURGICAL PATHOLOGY

## 2019-08-20 LAB — RHEUMATOID FACTOR: Rheumatoid fact SerPl-aCnc: <10 IU/mL (ref 0.0–13.9)

## 2019-08-20 LAB — ANTINUCLEAR ANTIBODIES, IFA: ANA Ab, IFA: NEGATIVE

## 2019-08-25 ENCOUNTER — Inpatient Hospital Stay (HOSPITAL_BASED_OUTPATIENT_CLINIC_OR_DEPARTMENT_OTHER): Payer: 59 | Admitting: Nurse Practitioner

## 2019-08-25 DIAGNOSIS — D473 Essential (hemorrhagic) thrombocythemia: Secondary | ICD-10-CM

## 2019-08-25 DIAGNOSIS — D75838 Other thrombocytosis: Secondary | ICD-10-CM

## 2019-08-25 DIAGNOSIS — Z9081 Acquired absence of spleen: Secondary | ICD-10-CM | POA: Diagnosis not present

## 2019-08-25 NOTE — Assessment & Plan Note (Addendum)
1.  Thrombocytosis: -Splenectomy and left nephrectomy at 64 months of age (1963) due to left kidney Wilms tumor, status post chemo and radiation therapy at [facility]. -Right neck lymph node biopsy in 1992, no evidence of malignancy at [place]. -No aqua genic pruritus suggested of myeloproliferative disorder.  She does report having fevers and hot flashes. -BCR/ABL by FISH was negative.  JAK2 V617 and reflex testing were negative.  This essentially rules out any myeloproliferative disorders.  Her platelet count today is improved. -Labs done on 08/19/2019 showed WBC 9.1, platelet count 498, Hemoglobin 14.6, HCT 46.2 -We will see her back in 3 months with repeat labs.

## 2019-08-25 NOTE — Progress Notes (Signed)
[place], [place] [address]   CLINIC:  Medical Oncology/Hematology  PCP:  Sharion Balloon, [place] [address] [phone]   REASON FOR VISIT: Follow-up for thrombocytosis  CURRENT THERAPY: Observation   INTERVAL HISTORY:  Natalie Cobb 59 y.o. female is called for a telephone visit today for a follow-up for her thrombocytosis.  Patient reports she has been doing well since her last visit.  She denies any easy bruising or bleeding.  She denies any recent history of any blood clots.  She denies any headaches.  She denies any vision changes. Denies any nausea, vomiting, or diarrhea. Denies any new pains. Had not noticed any recent bleeding such as epistaxis, hematuria or hematochezia. Denies recent chest pain on exertion, shortness of breath on minimal exertion, pre-syncopal episodes, or palpitations. Denies any numbness or tingling in hands or feet. Denies any recent fevers, infections, or recent hospitalizations. Patient reports appetite at 100% and energy level at 75%.  She is eating well maintaining her weight at this time.    REVIEW OF SYSTEMS:  Review of Systems  All other systems reviewed and are negative.    PAST MEDICAL/SURGICAL HISTORY:  Past Medical History:  Diagnosis Date  . Anxiety   . Asthma   . Depression   . Diverticulitis   . GERD (gastroesophageal reflux disease)   . Hernia of abdominal wall   . Hypertension   . IBS (irritable bowel syndrome)   . PAF (paroxysmal atrial fibrillation) ([place])   . Thrombocytosis ([place])    Splenectomy 1963  . 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 Left nephrectomy  1963  . LYMPH GLAND EXCISION     neck in 20's  . ORIF ANKLE FRACTURE Right 10/18/2013   Procedure: OPEN REDUCTION INTERNAL FIXATION (ORIF) RIGHT TRIMALLEOLAR ANKLE  FRACTURE;  Surgeon: Newt Minion, MD;  Location: [facility];  Service: Orthopedics;  Laterality: Right;  . REVISION OF SCAR     of abdominal scar age 69  . Splenectomy  1963  . TUBAL LIGATION    . WISDOM TOOTH EXTRACTION       SOCIAL HISTORY:  Social History   Socioeconomic History  . Marital status: Legally Separated    Spouse name: Not on file  . Number of children: Not on file  . Years of education: Not on file  . Highest education level: Not on file  Occupational History  . Not on file  Tobacco Use  . Smoking status: Former Smoker    Years: 10.00  . Smokeless tobacco: Never Used  Substance and Sexual Activity  . Alcohol use: Yes    Comment: rarely  . Drug use: Yes    Types: Marijuana  . Sexual activity: Not Currently    Birth control/protection: Surgical    Comment: Quit around 2010  Other Topics Concern  . Not on file  Social History Narrative  . Not on file   Social Determinants of Health   Financial Resource Strain:   . Difficulty of Paying Living Expenses: Not on file  Food Insecurity:   . Worried About Charity fundraiser in the Last Year: Not on file  . Ran Out of Food in the Last Year: Not on file  Transportation Needs:   . Lack of Transportation (Medical): Not on file  . Lack of  Transportation (Non-Medical): Not on file  Physical Activity:   . Days of Exercise per Week: Not on file  . Minutes of Exercise per Session: Not on file  Stress:   . Feeling of Stress : Not on file  Social Connections:   . Frequency of Communication with Friends and Family: Not on file  . Frequency of Social Gatherings with Friends and Family: Not on file  . Attends Religious Services: Not on file  . Active Member of Clubs or Organizations: Not on file  . Attends Archivist Meetings: Not on file  . Marital Status: Not on file  Intimate Partner Violence:   . Fear of Current or Ex-Partner: Not on file  . Emotionally Abused: Not on file  . Physically Abused: Not on  file  . Sexually Abused: Not on file    FAMILY HISTORY:  Family History  Problem Relation Age of Onset  . Stroke Mother   . Pulmonary fibrosis Father   . Brain cancer Paternal Grandmother     CURRENT MEDICATIONS:  Outpatient Encounter Medications as of 08/25/2019  Medication Sig  . albuterol (PROAIR HFA) 108 (90 Base) MCG/ACT inhaler Inhale 2 puffs into the lungs every 6 (six) hours as needed for wheezing or shortness of breath. (Patient not taking: Reported on 07/17/2019)  . apixaban (ELIQUIS) 5 MG TABS tablet Take 1 tablet (5 mg total) by mouth 2 (two) times daily.  . Cholecalciferol (VITAMIN D-3) 5000 UNITS TABS Take 5,000 Units by mouth every evening.   . clonazePAM (KLONOPIN) 1 MG tablet Take 1 tablet (1 mg total) by mouth 2 (two) times daily.  . fish oil-omega-3 fatty acids 1000 MG capsule Take 1 g by mouth every evening.   . metoprolol tartrate (LOPRESSOR) 50 MG tablet Take 1 tablet (50 mg total) by mouth 2 (two) times daily.  . montelukast (SINGULAIR) 10 MG tablet TAKE 1 TABLET BY MOUTH EVERYDAY AT BEDTIME  . pantoprazole (PROTONIX) 40 MG tablet Take 1 tablet (40 mg total) by mouth daily.  . simvastatin (ZOCOR) 40 MG tablet Take 1 tablet (40 mg total) by mouth daily at 6 PM.  . venlafaxine XR (EFFEXOR-XR) 150 MG 24 hr capsule Take 1 capsule (150 mg total) by mouth daily with breakfast.   No facility-administered encounter medications on file as of 08/25/2019.    ALLERGIES:  Allergies  Allergen Reactions  . Compazine [Prochlorperazine Edisylate] Other (See Comments)    Patient has "muscular" reaction to all medications that end in "ZINE"  . Meclizine     Patient has "muscular" reaction to all medications that end in "ZINE"  . Thorazine [Chlorpromazine] Other (See Comments)    Patient has "muscular" reaction to all medications that end in "ZINE"  . Sulfa Antibiotics Rash     Vital signs: -Deferred due to telephone visit  Physical Exam -Deferred due to telephone  visit -Patient was alert and oriented over the phone and in no acute distress.  LABORATORY DATA:  I have reviewed the labs as listed.  CBC    Component Value Date/Time   WBC 9.1 08/19/2019 1119   RBC 4.73 08/19/2019 1119   HGB 14.6 08/19/2019 1119   HGB 14.6 07/02/2019 0917   HCT 46.2 (H) 08/19/2019 1119   HCT 45.4 07/02/2019 0917   PLT 498 (H) 08/19/2019 1119   PLT 449 07/02/2019 0917   MCV 97.7 08/19/2019 1119   MCV 94 07/02/2019 0917   MCH 30.9 08/19/2019 1119   MCHC 31.6 08/19/2019 1119  RDW 13.5 08/19/2019 1119   RDW 12.5 07/02/2019 0917   LYMPHSABS 4.3 (H) 08/19/2019 1119   LYMPHSABS 6.6 (H) 07/02/2019 0917   MONOABS 0.8 08/19/2019 1119   EOSABS 0.3 08/19/2019 1119   EOSABS 0.3 07/02/2019 0917   BASOSABS 0.1 08/19/2019 1119   BASOSABS 0.1 07/02/2019 0917   CMP Latest Ref Rng & Units 08/19/2019 07/02/2019 07/22/2018  Glucose 70 - 99 mg/dL 108(H) 114(H) 142(H)  BUN 6 - 20 mg/dL 19 17 19   Creatinine 0.44 - 1.00 mg/dL 0.82 0.85 0.86  Sodium 135 - 145 mmol/L 136 140 143  Potassium 3.5 - 5.1 mmol/L 5.3(H) 5.1 5.0  Chloride 98 - 111 mmol/L 103 102 106  CO2 22 - 32 mmol/L 26 25 22   Calcium 8.9 - 10.3 mg/dL 9.4 10.2 9.6  Total Protein 6.5 - 8.1 g/dL 7.9 7.3 7.3  Total Bilirubin 0.3 - 1.2 mg/dL 1.2 0.7 0.8  Alkaline Phos 38 - 126 U/L 88 110 98  AST 15 - 41 U/L 27 29 23   ALT 0 - 44 U/L 27 30 19     All questions were answered to patient's stated satisfaction. Encouraged patient to call with any new concerns or questions before his next visit to the cancer center and we can certain see him sooner, if needed.      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  Thrombocytosis: -Splenectomy and left nephrectomy at 105 months of age (1963) due to left kidney Wilms tumor, status post chemo and radiation therapy at [facility]. -Right neck lymph node biopsy in 1992, no evidence of malignancy at [place]. -No aqua genic  pruritus suggested of myeloproliferative disorder.  She does report having fevers and hot flashes. -BCR/ABL by FISH was negative.  JAK2 V617 and reflex testing were negative.  This essentially rules out any myeloproliferative disorders.  Her platelet count today is improved. -Labs done on 08/19/2019 showed WBC 9.1, platelet count 498, Hemoglobin 14.6, HCT 46.2 -We will see her back in 3 months with repeat labs.      Orders placed this encounter:  Orders Placed This Encounter  Procedures  . Lactate dehydrogenase  . CBC with Differential/Platelet  . Comprehensive metabolic panel  . Ferritin  . Iron and TIBC  . Vitamin B12  . Vitamin D 25 hydroxy  . Folate    I provided 18 minutes of non face-to-face telephone visit time during this encounter, and > 50% was spent counseling as documented under my assessment & plan.  Francene Finders, FNP-C [place] [phone]

## 2019-08-26 ENCOUNTER — Ambulatory Visit (HOSPITAL_COMMUNITY): Payer: 59 | Admitting: Nurse Practitioner

## 2019-10-14 ENCOUNTER — Telehealth: Payer: Self-pay | Admitting: *Deleted

## 2019-10-14 NOTE — Telephone Encounter (Signed)
Pantoprazole Sod Dr 40 mg tab  [facility]  KEY : [id number]  PA sent to plan 10/14/19 - [facility]

## 2019-10-15 NOTE — Telephone Encounter (Signed)
Prior Auth for PANTOPRAZOLE 40 MG tablet- APPROVED   10/14/2019-10/13/2020  [id number]  Pharmacy Aware

## 2019-11-25 ENCOUNTER — Inpatient Hospital Stay (HOSPITAL_COMMUNITY): Payer: 59 | Attending: Hematology

## 2019-12-02 ENCOUNTER — Ambulatory Visit (HOSPITAL_COMMUNITY): Payer: 59 | Admitting: Nurse Practitioner

## 2019-12-29 ENCOUNTER — Other Ambulatory Visit: Payer: Self-pay | Admitting: Family

## 2019-12-29 DIAGNOSIS — K219 Gastro-esophageal reflux disease without esophagitis: Secondary | ICD-10-CM

## 2020-01-26 ENCOUNTER — Other Ambulatory Visit: Payer: Self-pay | Admitting: Family

## 2020-01-26 DIAGNOSIS — K219 Gastro-esophageal reflux disease without esophagitis: Secondary | ICD-10-CM

## 2020-01-26 NOTE — Telephone Encounter (Signed)
[place]. NTBS 30 days given 12/30/19

## 2020-01-27 NOTE — Telephone Encounter (Signed)
Busy-cb 6/22

## 2020-01-28 ENCOUNTER — Other Ambulatory Visit: Payer: Self-pay | Admitting: Family

## 2020-01-28 DIAGNOSIS — F419 Anxiety disorder, unspecified: Secondary | ICD-10-CM

## 2020-01-28 DIAGNOSIS — Z79899 Other long term (current) drug therapy: Secondary | ICD-10-CM

## 2020-01-28 DIAGNOSIS — F1320 Sedative, hypnotic or anxiolytic dependence, uncomplicated: Secondary | ICD-10-CM

## 2020-01-29 ENCOUNTER — Other Ambulatory Visit: Payer: Self-pay | Admitting: Family

## 2020-01-29 DIAGNOSIS — F419 Anxiety disorder, unspecified: Secondary | ICD-10-CM

## 2020-01-29 DIAGNOSIS — Z79899 Other long term (current) drug therapy: Secondary | ICD-10-CM

## 2020-01-29 DIAGNOSIS — F1320 Sedative, hypnotic or anxiolytic dependence, uncomplicated: Secondary | ICD-10-CM

## 2020-01-30 ENCOUNTER — Other Ambulatory Visit: Payer: Self-pay | Admitting: Family

## 2020-01-30 DIAGNOSIS — F419 Anxiety disorder, unspecified: Secondary | ICD-10-CM

## 2020-01-30 DIAGNOSIS — F1320 Sedative, hypnotic or anxiolytic dependence, uncomplicated: Secondary | ICD-10-CM

## 2020-01-30 DIAGNOSIS — Z79899 Other long term (current) drug therapy: Secondary | ICD-10-CM

## 2020-01-30 MED ORDER — CLONAZEPAM 1 MG PO TABS: 1.0000 mg | ORAL_TABLET | Freq: Two times a day (BID) | ORAL | 0 refills | Status: DC

## 2020-01-30 NOTE — Telephone Encounter (Signed)
Called patient, call would not go through do to network difficulties.

## 2020-01-30 NOTE — Telephone Encounter (Signed)
Called patient, call would not go through due to network difficulties.

## 2020-01-30 NOTE — Telephone Encounter (Signed)
Klonopin is a controlled substance and ntbs for refill

## 2020-02-02 NOTE — Telephone Encounter (Signed)
rx was sent in 01/30/20

## 2020-02-10 ENCOUNTER — Other Ambulatory Visit: Payer: Self-pay | Admitting: Family

## 2020-02-10 DIAGNOSIS — K219 Gastro-esophageal reflux disease without esophagitis: Secondary | ICD-10-CM

## 2020-02-21 ENCOUNTER — Other Ambulatory Visit: Payer: Self-pay | Admitting: Family

## 2020-02-21 DIAGNOSIS — I480 Paroxysmal atrial fibrillation: Secondary | ICD-10-CM

## 2020-02-27 ENCOUNTER — Ambulatory Visit (INDEPENDENT_AMBULATORY_CARE_PROVIDER_SITE_OTHER): Payer: Self-pay | Admitting: Family

## 2020-02-27 ENCOUNTER — Other Ambulatory Visit: Payer: Self-pay

## 2020-02-27 ENCOUNTER — Encounter: Payer: Self-pay | Admitting: Family

## 2020-02-27 VITALS — BP 119/73 | HR 74 | Temp 97.6°F | Ht 63.0 in | Wt 197.4 lb

## 2020-02-27 DIAGNOSIS — E669 Obesity, unspecified: Secondary | ICD-10-CM

## 2020-02-27 DIAGNOSIS — F1320 Sedative, hypnotic or anxiolytic dependence, uncomplicated: Secondary | ICD-10-CM

## 2020-02-27 DIAGNOSIS — F331 Major depressive disorder, recurrent, moderate: Secondary | ICD-10-CM

## 2020-02-27 DIAGNOSIS — E785 Hyperlipidemia, unspecified: Secondary | ICD-10-CM

## 2020-02-27 DIAGNOSIS — F419 Anxiety disorder, unspecified: Secondary | ICD-10-CM

## 2020-02-27 DIAGNOSIS — E8881 Metabolic syndrome: Secondary | ICD-10-CM

## 2020-02-27 DIAGNOSIS — Z905 Acquired absence of kidney: Secondary | ICD-10-CM

## 2020-02-27 DIAGNOSIS — K219 Gastro-esophageal reflux disease without esophagitis: Secondary | ICD-10-CM

## 2020-02-27 DIAGNOSIS — I480 Paroxysmal atrial fibrillation: Secondary | ICD-10-CM

## 2020-02-27 DIAGNOSIS — D473 Essential (hemorrhagic) thrombocythemia: Secondary | ICD-10-CM

## 2020-02-27 DIAGNOSIS — Z9081 Acquired absence of spleen: Secondary | ICD-10-CM

## 2020-02-27 DIAGNOSIS — D75838 Other thrombocytosis: Secondary | ICD-10-CM

## 2020-02-27 DIAGNOSIS — J4520 Mild intermittent asthma, uncomplicated: Secondary | ICD-10-CM

## 2020-02-27 DIAGNOSIS — Z79899 Other long term (current) drug therapy: Secondary | ICD-10-CM

## 2020-02-27 DIAGNOSIS — I10 Essential (primary) hypertension: Secondary | ICD-10-CM

## 2020-02-27 MED ORDER — CLONAZEPAM 1 MG PO TABS: 1.0000 mg | ORAL_TABLET | Freq: Two times a day (BID) | ORAL | 2 refills | Status: DC

## 2020-02-27 MED ORDER — ALBUTEROL SULFATE HFA 108 (90 BASE) MCG/ACT IN AERS: 2.0000 | INHALATION_SPRAY | Freq: Four times a day (QID) | RESPIRATORY_TRACT | 1 refills | Status: DC | PRN

## 2020-02-27 NOTE — Patient Instructions (Signed)

## 2020-02-27 NOTE — Progress Notes (Signed)
Subjective:    Patient ID: Natalie Cobb, female    DOB: 1960-12-27, 59 y.o.   MRN: [id number]  Chief Complaint  Patient presents with  . Medical Management of Chronic Issues   PT calls the office for chronic follow up. PT has A Fib and taking Eliquis daily. She reports her insurance ran out this month and was not able to pick up her rx, but states she will get insurance starting 03/07/20.  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 She has seen Hematologists and was cleared.  Hypertension This is a chronic problem. The current episode started more than 1 year ago. The problem has been resolved since onset. The problem is controlled. Associated symptoms include anxiety and shortness of breath ("some times if I'm going up a hill"). Pertinent negatives include no malaise/fatigue or peripheral edema. Risk factors for coronary artery disease include dyslipidemia, obesity and sedentary lifestyle. The current treatment provides moderate improvement. There is no history of CAD/MI.  Asthma She complains of shortness of breath ("some times if I'm going up a hill"). There is no cough or frequent throat clearing. This is a chronic problem. The current episode started more than 1 year ago. The problem occurs intermittently. The problem has been waxing and waning. Associated symptoms include heartburn. Pertinent negatives include no malaise/fatigue. Her symptoms are alleviated by rest and beta-agonist. She reports moderate improvement on treatment. Her past medical history is significant for asthma.  Gastroesophageal Reflux She complains of belching and heartburn. She reports no coughing. This is a chronic problem. The current episode started more than 1 year ago. The problem occurs occasionally. The problem has been resolved. Risk factors include obesity. She has tried a PPI for the symptoms. The treatment provided moderate relief.  Hyperlipidemia This is a chronic problem. The current episode started  more than 1 year ago. The problem is uncontrolled. Recent lipid tests were reviewed and are high. Exacerbating diseases include obesity. Associated symptoms include shortness of breath ("some times if I'm going up a hill"). Current antihyperlipidemic treatment includes statins. The current treatment provides moderate improvement of lipids. Risk factors for coronary artery disease include dyslipidemia, hypertension, a sedentary lifestyle and post-menopausal.  Anxiety Presents for follow-up visit. Symptoms include depressed mood, excessive worry, irritability, nervous/anxious behavior and shortness of breath ("some times if I'm going up a hill"). Patient reports no restlessness. Symptoms occur occasionally. The severity of symptoms is moderate. The quality of sleep is good.   Her past medical history is significant for asthma.  Depression        This is a chronic problem.  The current episode started more than 1 year ago.   The onset quality is gradual.   The problem occurs intermittently.  The problem has been waxing and waning since onset.  Associated symptoms include sad.  Associated symptoms include no helplessness, no hopelessness, not irritable and no restlessness.  Past treatments include SNRIs - Serotonin and norepinephrine reuptake inhibitors.  Compliance with treatment is good.  Past medical history includes anxiety.       Review of Systems  Constitutional: Positive for irritability. Negative for malaise/fatigue.  Respiratory: Positive for shortness of breath ("some times if I'm going up a hill"). Negative for cough.   Gastrointestinal: Positive for heartburn.  Psychiatric/Behavioral: Positive for depression. The patient is nervous/anxious.   All other systems reviewed and are negative.      Objective:   Physical Exam Vitals reviewed.  Constitutional:      General: She  is not irritable.She is not in acute distress.    Appearance: She is well-developed.  HENT:     Head:  Normocephalic and atraumatic.     Right Ear: Tympanic membrane normal.     Left Ear: Tympanic membrane normal.     Mouth/Throat:     Comments: Dry mouth Eyes:     Pupils: Pupils are equal, round, and reactive to light.  Neck:     Thyroid: No thyromegaly.  Cardiovascular:     Rate and Rhythm: Normal rate and regular rhythm.     Heart sounds: Normal heart sounds. No murmur heard.   Pulmonary:     Effort: Pulmonary effort is normal. No respiratory distress.     Breath sounds: Normal breath sounds. No wheezing.  Abdominal:     General: Bowel sounds are normal. There is no distension.     Palpations: Abdomen is soft.     Tenderness: There is no abdominal tenderness.  Musculoskeletal:        General: No tenderness. Normal range of motion.     Cervical back: Normal range of motion and neck supple.  Skin:    General: Skin is warm and dry.  Neurological:     Mental Status: She is alert and oriented to person, place, and time.     Cranial Nerves: No cranial nerve deficit.     Deep Tendon Reflexes: Reflexes are normal and symmetric.  Psychiatric:        Behavior: Behavior normal.        Thought Content: Thought content normal.        Judgment: Judgment normal.       BP 119/73   Pulse 74   Temp 97.6 F (36.4 C) (Temporal)   Ht 5\' 3"  (1.6 m)   Wt 197 lb 6.4 oz (89.5 kg)   SpO2 94%   BMI 34.97 kg/m      Assessment & Plan:  Natalie Cobb comes in today with chief complaint of Medical Management of Chronic Issues   Diagnosis and orders addressed:  1. Anxiety - clonazePAM (KLONOPIN) 1 MG tablet; Take 1 tablet (1 mg total) by mouth 2 (two) times daily.  Dispense: 50 tablet; Refill: 2  2. Benzodiazepine dependence (HCC) - clonazePAM (KLONOPIN) 1 MG tablet; Take 1 tablet (1 mg total) by mouth 2 (two) times daily.  Dispense: 50 tablet; Refill: 2  3. Controlled substance agreement signed - clonazePAM (KLONOPIN) 1 MG tablet; Take 1 tablet (1 mg total) by mouth 2 (two) times  daily.  Dispense: 50 tablet; Refill: 2  4. Mild intermittent asthma without complication - albuterol (PROAIR HFA) 108 (90 Base) MCG/ACT inhaler; Inhale 2 puffs into the lungs every 6 (six) hours as needed for wheezing or shortness of breath.  Dispense: 6.7 g; Refill: 1  5. Essential hypertension  6. PAF (paroxysmal atrial fibrillation) ([place])  7. Gastroesophageal reflux disease without esophagitis  8. Moderate episode of recurrent major depressive disorder ([place])  9. H/O kidney removal  10. Hyperlipidemia, unspecified hyperlipidemia typ  11. Obesity (BMI 30-39.9)  12. Metabolic syndrome   13. 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 Contract up dated today. I have decreased her Klonopin to #50 from #60. We discussed importance of continuing to decrease.  Pt is self right now and will get insurance starting 03/07/20, we will hold off on lab work and drug screen until then.  Health Maintenance reviewed Diet and exercise encouraged  Follow up plan: 3 months    Evelina Dun, FNP

## 2020-03-04 ENCOUNTER — Other Ambulatory Visit: Payer: Self-pay | Admitting: Family

## 2020-03-04 DIAGNOSIS — K219 Gastro-esophageal reflux disease without esophagitis: Secondary | ICD-10-CM

## 2020-03-10 ENCOUNTER — Other Ambulatory Visit: Payer: Self-pay | Admitting: Family

## 2020-03-10 DIAGNOSIS — F419 Anxiety disorder, unspecified: Secondary | ICD-10-CM

## 2020-03-10 DIAGNOSIS — J4520 Mild intermittent asthma, uncomplicated: Secondary | ICD-10-CM

## 2020-03-10 DIAGNOSIS — F331 Major depressive disorder, recurrent, moderate: Secondary | ICD-10-CM

## 2020-03-10 MED ORDER — VENLAFAXINE HCL ER 150 MG PO CP24: 150.0000 mg | ORAL_CAPSULE | Freq: Every day | ORAL | 0 refills | Status: DC

## 2020-03-10 MED ORDER — MONTELUKAST SODIUM 10 MG PO TABS: ORAL_TABLET | ORAL | 2 refills | Status: DC

## 2020-03-10 NOTE — Addendum Note (Signed)
Addended by: Antonietta Barcelona D on: 03/10/2020 12:35 PM   Modules accepted: Orders

## 2020-03-10 NOTE — Telephone Encounter (Signed)
Account was on hold, talked with billing dept

## 2020-03-10 NOTE — Addendum Note (Signed)
Addended by: Antonietta Barcelona D on: 03/10/2020 12:36 PM   Modules accepted: Orders

## 2020-03-12 ENCOUNTER — Other Ambulatory Visit: Payer: Self-pay | Admitting: Family

## 2020-03-12 DIAGNOSIS — K219 Gastro-esophageal reflux disease without esophagitis: Secondary | ICD-10-CM

## 2020-04-06 ENCOUNTER — Other Ambulatory Visit: Payer: Self-pay | Admitting: Family

## 2020-04-06 DIAGNOSIS — K219 Gastro-esophageal reflux disease without esophagitis: Secondary | ICD-10-CM

## 2020-04-20 ENCOUNTER — Other Ambulatory Visit: Payer: Self-pay | Admitting: Family

## 2020-04-20 DIAGNOSIS — I480 Paroxysmal atrial fibrillation: Secondary | ICD-10-CM

## 2020-05-24 ENCOUNTER — Other Ambulatory Visit: Payer: Self-pay | Admitting: Family

## 2020-05-24 DIAGNOSIS — I480 Paroxysmal atrial fibrillation: Secondary | ICD-10-CM

## 2020-06-03 ENCOUNTER — Encounter: Payer: Self-pay | Admitting: Family

## 2020-06-03 ENCOUNTER — Other Ambulatory Visit: Payer: Self-pay

## 2020-06-03 ENCOUNTER — Ambulatory Visit (INDEPENDENT_AMBULATORY_CARE_PROVIDER_SITE_OTHER): Payer: No Typology Code available for payment source | Admitting: Family

## 2020-06-03 VITALS — BP 114/71 | HR 66 | Temp 97.1°F | Ht 63.0 in | Wt 192.2 lb

## 2020-06-03 DIAGNOSIS — F419 Anxiety disorder, unspecified: Secondary | ICD-10-CM | POA: Diagnosis not present

## 2020-06-03 DIAGNOSIS — E785 Hyperlipidemia, unspecified: Secondary | ICD-10-CM

## 2020-06-03 DIAGNOSIS — K219 Gastro-esophageal reflux disease without esophagitis: Secondary | ICD-10-CM

## 2020-06-03 DIAGNOSIS — I10 Essential (primary) hypertension: Secondary | ICD-10-CM | POA: Diagnosis not present

## 2020-06-03 DIAGNOSIS — F331 Major depressive disorder, recurrent, moderate: Secondary | ICD-10-CM

## 2020-06-03 DIAGNOSIS — E8881 Metabolic syndrome: Secondary | ICD-10-CM

## 2020-06-03 DIAGNOSIS — Z79899 Other long term (current) drug therapy: Secondary | ICD-10-CM

## 2020-06-03 DIAGNOSIS — I480 Paroxysmal atrial fibrillation: Secondary | ICD-10-CM

## 2020-06-03 DIAGNOSIS — F1320 Sedative, hypnotic or anxiolytic dependence, uncomplicated: Secondary | ICD-10-CM | POA: Diagnosis not present

## 2020-06-03 DIAGNOSIS — E669 Obesity, unspecified: Secondary | ICD-10-CM

## 2020-06-03 DIAGNOSIS — J4520 Mild intermittent asthma, uncomplicated: Secondary | ICD-10-CM

## 2020-06-03 MED ORDER — CLONAZEPAM 1 MG PO TABS: 1.0000 mg | ORAL_TABLET | Freq: Two times a day (BID) | ORAL | 2 refills | Status: DC

## 2020-06-03 MED ORDER — ALBUTEROL SULFATE HFA 108 (90 BASE) MCG/ACT IN AERS: 2.0000 | INHALATION_SPRAY | Freq: Four times a day (QID) | RESPIRATORY_TRACT | 1 refills | Status: AC | PRN

## 2020-06-03 MED ORDER — MONTELUKAST SODIUM 10 MG PO TABS: ORAL_TABLET | ORAL | 2 refills | Status: DC

## 2020-06-03 MED ORDER — METOPROLOL TARTRATE 50 MG PO TABS: 50.0000 mg | ORAL_TABLET | Freq: Two times a day (BID) | ORAL | 3 refills | Status: AC

## 2020-06-03 MED ORDER — VENLAFAXINE HCL ER 150 MG PO CP24: 150.0000 mg | ORAL_CAPSULE | Freq: Every day | ORAL | 1 refills | Status: DC

## 2020-06-03 MED ORDER — SIMVASTATIN 40 MG PO TABS: 40.0000 mg | ORAL_TABLET | Freq: Every day | ORAL | 3 refills | Status: DC

## 2020-06-03 MED ORDER — PANTOPRAZOLE SODIUM 40 MG PO TBEC: 40.0000 mg | DELAYED_RELEASE_TABLET | Freq: Every day | ORAL | 1 refills | Status: DC

## 2020-06-03 MED ORDER — APIXABAN 5 MG PO TABS: 5.0000 mg | ORAL_TABLET | Freq: Two times a day (BID) | ORAL | 1 refills | Status: DC

## 2020-06-03 NOTE — Patient Instructions (Signed)
Health Maintenance, Female Adopting a healthy lifestyle and getting preventive care are important in promoting health and wellness. Ask your health care provider about:  The right schedule for you to have regular tests and exams.  Things you can do on your own to prevent diseases and keep yourself healthy. What should I know about diet, weight, and exercise? Eat a healthy diet   Eat a diet that includes plenty of vegetables, fruits, low-fat dairy products, and lean protein.  Do not eat a lot of foods that are high in solid fats, added sugars, or sodium. Maintain a healthy weight Body mass index (BMI) is used to identify weight problems. It estimates body fat based on height and weight. Your health care provider can help determine your BMI and help you achieve or maintain a healthy weight. Get regular exercise Get regular exercise. This is one of the most important things you can do for your health. Most adults should:  Exercise for at least 150 minutes each week. The exercise should increase your heart rate and make you sweat (moderate-intensity exercise).  Do strengthening exercises at least twice a week. This is in addition to the moderate-intensity exercise.  Spend less time sitting. Even light physical activity can be beneficial. Watch cholesterol and blood lipids Have your blood tested for lipids and cholesterol at 59 years of age, then have this test every 5 years. Have your cholesterol levels checked more often if:  Your lipid or cholesterol levels are high.  You are older than 59 years of age.  You are at high risk for heart disease. What should I know about cancer screening? Depending on your health history and family history, you may need to have cancer screening at various ages. This may include screening for:  Breast cancer.  Cervical cancer.  Colorectal cancer.  Skin cancer.  Lung cancer. What should I know about heart disease, diabetes, and high blood  pressure? Blood pressure and heart disease  High blood pressure causes heart disease and increases the risk of stroke. This is more likely to develop in people who have high blood pressure readings, are of African descent, or are overweight.  Have your blood pressure checked: ? Every 3-5 years if you are 18-39 years of age. ? Every year if you are 40 years old or older. Diabetes Have regular diabetes screenings. This checks your fasting blood sugar level. Have the screening done:  Once every three years after age 40 if you are at a normal weight and have a low risk for diabetes.  More often and at a younger age if you are overweight or have a high risk for diabetes. What should I know about preventing infection? Hepatitis B If you have a higher risk for hepatitis B, you should be screened for this virus. Talk with your health care provider to find out if you are at risk for hepatitis B infection. Hepatitis C Testing is recommended for:  Everyone born from 1945 through 1965.  Anyone with known risk factors for hepatitis C. Sexually transmitted infections (STIs)  Get screened for STIs, including gonorrhea and chlamydia, if: ? You are sexually active and are younger than 59 years of age. ? You are older than 59 years of age and your health care provider tells you that you are at risk for this type of infection. ? Your sexual activity has changed since you were last screened, and you are at increased risk for chlamydia or gonorrhea. Ask your health care provider if   you are at risk.  Ask your health care provider about whether you are at high risk for HIV. Your health care provider may recommend a prescription medicine to help prevent HIV infection. If you choose to take medicine to prevent HIV, you should first get tested for HIV. You should then be tested every 3 months for as long as you are taking the medicine. Pregnancy  If you are about to stop having your period (premenopausal) and  you may become pregnant, seek counseling before you get pregnant.  Take 400 to 800 micrograms (mcg) of folic acid every day if you become pregnant.  Ask for birth control (contraception) if you want to prevent pregnancy. Osteoporosis and menopause Osteoporosis is a disease in which the bones lose minerals and strength with aging. This can result in bone fractures. If you are 65 years old or older, or if you are at risk for osteoporosis and fractures, ask your health care provider if you should:  Be screened for bone loss.  Take a calcium or vitamin D supplement to lower your risk of fractures.  Be given hormone replacement therapy (HRT) to treat symptoms of menopause. Follow these instructions at home: Lifestyle  Do not use any products that contain nicotine or tobacco, such as cigarettes, e-cigarettes, and chewing tobacco. If you need help quitting, ask your health care provider.  Do not use street drugs.  Do not share needles.  Ask your health care provider for help if you need support or information about quitting drugs. Alcohol use  Do not drink alcohol if: ? Your health care provider tells you not to drink. ? You are pregnant, may be pregnant, or are planning to become pregnant.  If you drink alcohol: ? Limit how much you use to 0-1 drink a day. ? Limit intake if you are breastfeeding.  Be aware of how much alcohol is in your drink. In the [place] one drink equals one 12 oz bottle of beer (355 mL), one 5 oz glass of wine (148 mL), or one 1 oz glass of hard liquor (44 mL). General instructions  Schedule regular health, dental, and eye exams.  Stay current with your vaccines.  Tell your health care provider if: ? You often feel depressed. ? You have ever been abused or do not feel safe at home. Summary  Adopting a healthy lifestyle and getting preventive care are important in promoting health and wellness.  Follow your health care provider's instructions about healthy  diet, exercising, and getting tested or screened for diseases.  Follow your health care provider's instructions on monitoring your cholesterol and blood pressure. This information is not intended to replace advice given to you by your health care provider. Make sure you discuss any questions you have with your health care provider. Document Revised: 07/17/2018 Document Reviewed: 07/17/2018 Elsevier Patient Education  2020 [employer] Inc[employer]  

## 2020-06-03 NOTE — Progress Notes (Signed)
rr

## 2020-06-03 NOTE — Progress Notes (Signed)
Subjective:    Patient ID: Natalie Cobb, female    DOB: 1961-03-13, 59 y.o.   MRN: [id number] [place] office for chronic follow up. PT has A Fib and taking Eliquis daily. 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 She has seen Hematologists and was cleared.  Hyperlipidemia This is a chronic problem. The current episode started more than 1 year ago. The problem is uncontrolled. Recent lipid tests were reviewed and are high. Exacerbating diseases include obesity. Associated symptoms include shortness of breath. Current antihyperlipidemic treatment includes statins. The current treatment provides moderate improvement of lipids. Risk factors for coronary artery disease include dyslipidemia, hypertension, a sedentary lifestyle and post-menopausal.  Hypertension This is a chronic problem. The current episode started more than 1 year ago. The problem has been resolved since onset. The problem is controlled. Associated symptoms include anxiety, peripheral edema and shortness of breath. Pertinent negatives include no malaise/fatigue. Risk factors for coronary artery disease include dyslipidemia, obesity and sedentary lifestyle. The current treatment provides moderate improvement.  Asthma She complains of cough, shortness of breath and wheezing. This is a chronic problem. The current episode started more than 1 year ago. The problem occurs intermittently. The problem has been waxing and waning. Associated symptoms include heartburn. Pertinent negatives include no malaise/fatigue. Her symptoms are alleviated by rest. She reports moderate improvement on treatment. Her past medical history is significant for asthma.  Gastroesophageal Reflux She complains of belching, coughing, heartburn and wheezing. This is a chronic problem. The current episode started more than 1 year ago. The problem occurs occasionally. Risk factors include obesity. She has tried a PPI for the symptoms. The treatment provided  moderate relief.  Anxiety Presents for follow-up visit. Symptoms include depressed mood, nervous/anxious behavior and shortness of breath. Symptoms occur occasionally. The severity of symptoms is moderate.   Her past medical history is significant for asthma.      Review of Systems  Constitutional: Negative for malaise/fatigue.  Respiratory: Positive for cough, shortness of breath and wheezing.   Gastrointestinal: Positive for heartburn.  Psychiatric/Behavioral: The patient is nervous/anxious.   All other systems reviewed and are negative.      Objective:   Physical Exam Vitals reviewed.  Constitutional:      General: She is not in acute distress.    Appearance: She is well-developed. She is obese.  HENT:     Head: Normocephalic and atraumatic.     Right Ear: Tympanic membrane normal.     Left Ear: Tympanic membrane normal.  Eyes:     Pupils: Pupils are equal, round, and reactive to light.  Neck:     Thyroid: No thyromegaly.  Cardiovascular:     Rate and Rhythm: Normal rate and regular rhythm.     Heart sounds: Normal heart sounds. No murmur heard.   Pulmonary:     Effort: Pulmonary effort is normal. No respiratory distress.     Breath sounds: Normal breath sounds. No wheezing.  Abdominal:     General: Bowel sounds are normal. There is no distension.     Palpations: Abdomen is soft.     Tenderness: There is no abdominal tenderness.  Musculoskeletal:        General: No tenderness. Normal range of motion.     Cervical back: Normal range of motion and neck supple.  Skin:    General: Skin is warm and dry.  Neurological:     Mental Status: She is alert and oriented to person, place, and time.  Cranial Nerves: No cranial nerve deficit.     Deep Tendon Reflexes: Reflexes are normal and symmetric.  Psychiatric:        Behavior: Behavior normal.        Thought Content: Thought content normal.        Judgment: Judgment normal.      BP 114/71   Pulse 66   Temp  (!) 97.1 F (36.2 C) (Temporal)   Ht _0  (1.6 m)   Wt 192 lb 3.2 oz (87.2 kg)   SpO2 91%   BMI 34.05 kg/m       Assessment & Plan:  [name] comes in today with chief complaint of Medical Management of Chronic Issues and Hyperlipidemia   Diagnosis and orders addressed:  1. Anxiety - clonazePAM (KLONOPIN) 1 MG tablet; Take 1 tablet (1 mg total) by mouth 2 (two) times daily.  Dispense: 50 tablet; Refill: 2 - venlafaxine XR (EFFEXOR-XR) 150 MG 24 hr capsule; Take 1 capsule (150 mg total) by mouth daily with breakfast.  Dispense: 90 capsule; Refill: 1 - CMP14+EGFR - CBC with Differential/Platelet  2. Benzodiazepine dependence (HCC) - clonazePAM (KLONOPIN) 1 MG tablet; Take 1 tablet (1 mg total) by mouth 2 (two) times daily.  Dispense: 50 tablet; Refill: 2 - CMP14+EGFR - CBC with Differential/Platelet - ToxASSURE Select 13 (MW), Urine  3. Controlled substance agreement signed - clonazePAM (KLONOPIN) 1 MG tablet; Take 1 tablet (1 mg total) by mouth 2 (two) times daily.  Dispense: 50 tablet; Refill: 2 - CMP14+EGFR - CBC with Differential/Platelet - ToxASSURE Select 13 (MW), Urine  4. Moderate episode of recurrent major depressive disorder (HCC) - venlafaxine XR (EFFEXOR-XR) 150 MG 24 hr capsule; Take 1 capsule (150 mg total) by mouth daily with breakfast.  Dispense: 90 capsule; Refill: 1 - CMP14+EGFR - CBC with Differential/Platelet  5. Essential hypertension - metoprolol tartrate (LOPRESSOR) 50 MG tablet; Take 1 tablet (50 mg total) by mouth 2 (two) times daily.  Dispense: 180 tablet; Refill: 3 - CMP14+EGFR - CBC with Differential/Platelet  6. PAF (paroxysmal atrial fibrillation) (HCC)  - metoprolol tartrate (LOPRESSOR) 50 MG tablet; Take 1 tablet (50 mg total) by mouth 2 (two) times daily.  Dispense: 180 tablet; Refill: 3 - apixaban (ELIQUIS) 5 MG TABS tablet; Take 1 tablet (5 mg total) by mouth 2 (two) times daily.  Dispense: 180 tablet; Refill: 1 - CMP14+EGFR -  CBC with Differential/Platelet  7. Hyperlipidemia, unspecified hyperlipidemia type - simvastatin (ZOCOR) 40 MG tablet; Take 1 tablet (40 mg total) by mouth daily at 6 PM.  Dispense: 90 tablet; Refill: 3 - CMP14+EGFR - CBC with Differential/Platelet - Lipid panel  8. Gastroesophageal reflux disease without esophagitis - pantoprazole (PROTONIX) 40 MG tablet; Take 1 tablet (40 mg total) by mouth daily.  Dispense: 90 tablet; Refill: 1 - CMP14+EGFR - CBC with Differential/Platelet  9. Mild intermittent asthma without complication - albuterol (PROAIR HFA) 108 (90 Base) MCG/ACT inhaler; Inhale 2 puffs into the lungs every 6 (six) hours as needed for wheezing or shortness of breath.  Dispense: 6.7 g; Refill: 1 - montelukast (SINGULAIR) 10 MG tablet; TAKE 1 TABLET BY MOUTH EVERYDAY AT BEDTIME  Dispense: 90 tablet; Refill: 2 - CMP14+EGFR - CBC with Differential/Platelet  10. Primary hypertension - CMP14+EGFR - CBC with Differential/Platelet  11. Metabolic syndrome - ZOX09+UEAV - CBC with Differential/Platelet  12. Obesity (BMI 30-39.9) - CMP14+EGFR - CBC with Differential/Platelet   Labs pending Continue current meds- low fat diet and exercise and recheck in  3 months Health Maintenance reviewed Diet and exercise encouraged  Follow up plan: 3 months    Evelina Dun, FNP

## 2020-06-04 LAB — LIPID PANEL
Chol/HDL Ratio: 4.4 ratio (ref 0.0–4.4)
Cholesterol, Total: 181 mg/dL (ref 100–199)
HDL: 41 mg/dL (ref >39)
LDL Chol Calc (NIH): 118 mg/dL — ABNORMAL HIGH (ref 0–99)
Triglycerides: 123 mg/dL (ref 0–149)
VLDL Cholesterol Cal: 22 mg/dL (ref 5–40)

## 2020-06-04 LAB — CMP14+EGFR
ALT: 22 IU/L (ref 0–32)
AST: 21 IU/L (ref 0–40)
Albumin/Globulin Ratio: 1.3 (ref 1.2–2.2)
Albumin: 4.3 g/dL (ref 3.8–4.9)
Alkaline Phosphatase: 102 IU/L (ref 44–121)
BUN/Creatinine Ratio: 24 — ABNORMAL HIGH (ref 9–23)
BUN: 24 mg/dL (ref 6–24)
Bilirubin Total: 0.7 mg/dL (ref 0.0–1.2)
CO2: 21 mmol/L (ref 20–29)
Calcium: 10.6 mg/dL — ABNORMAL HIGH (ref 8.7–10.2)
Chloride: 102 mmol/L (ref 96–106)
Creatinine, Ser: 0.99 mg/dL (ref 0.57–1.00)
GFR calc Af Amer: 72 mL/min/{1.73_m2} (ref >59)
GFR calc non Af Amer: 63 mL/min/{1.73_m2} (ref >59)
Globulin, Total: 3.3 g/dL (ref 1.5–4.5)
Glucose: 106 mg/dL — ABNORMAL HIGH (ref 65–99)
Potassium: 5 mmol/L (ref 3.5–5.2)
Sodium: 138 mmol/L (ref 134–144)
Total Protein: 7.6 g/dL (ref 6.0–8.5)

## 2020-06-04 LAB — CBC WITH DIFFERENTIAL/PLATELET
Basophils Absolute: 0.1 10*3/uL (ref 0.0–0.2)
Basos: 1 %
EOS (ABSOLUTE): 0.3 10*3/uL (ref 0.0–0.4)
Eos: 3 %
Hematocrit: 44.5 % (ref 34.0–46.6)
Hemoglobin: 14.6 g/dL (ref 11.1–15.9)
Immature Grans (Abs): 0 10*3/uL (ref 0.0–0.1)
Immature Granulocytes: 0 %
Lymphocytes Absolute: 5.2 10*3/uL — ABNORMAL HIGH (ref 0.7–3.1)
Lymphs: 46 %
MCH: 30 pg (ref 26.6–33.0)
MCHC: 32.8 g/dL (ref 31.5–35.7)
MCV: 92 fL (ref 79–97)
Monocytes Absolute: 1.1 10*3/uL — ABNORMAL HIGH (ref 0.1–0.9)
Monocytes: 10 %
Neutrophils Absolute: 4.4 10*3/uL (ref 1.4–7.0)
Neutrophils: 40 %
Platelets: 493 10*3/uL — ABNORMAL HIGH (ref 150–450)
RBC: 4.86 x10E6/uL (ref 3.77–5.28)
RDW: 12.6 % (ref 11.7–15.4)
WBC: 11.1 10*3/uL — ABNORMAL HIGH (ref 3.4–10.8)

## 2020-06-08 LAB — TOXASSURE SELECT 13 (MW), URINE

## 2020-06-11 ENCOUNTER — Telehealth: Payer: Self-pay

## 2020-06-11 NOTE — Telephone Encounter (Signed)
In result notes.  

## 2020-09-02 ENCOUNTER — Ambulatory Visit (INDEPENDENT_AMBULATORY_CARE_PROVIDER_SITE_OTHER): Payer: No Typology Code available for payment source | Admitting: Family

## 2020-09-02 ENCOUNTER — Other Ambulatory Visit: Payer: Self-pay

## 2020-09-02 ENCOUNTER — Encounter: Payer: Self-pay | Admitting: Family

## 2020-09-02 VITALS — BP 122/72 | HR 74 | Temp 97.1°F | Ht 63.0 in | Wt 185.0 lb

## 2020-09-02 DIAGNOSIS — Z79899 Other long term (current) drug therapy: Secondary | ICD-10-CM

## 2020-09-02 DIAGNOSIS — J4520 Mild intermittent asthma, uncomplicated: Secondary | ICD-10-CM | POA: Diagnosis not present

## 2020-09-02 DIAGNOSIS — E785 Hyperlipidemia, unspecified: Secondary | ICD-10-CM

## 2020-09-02 DIAGNOSIS — I480 Paroxysmal atrial fibrillation: Secondary | ICD-10-CM | POA: Diagnosis not present

## 2020-09-02 DIAGNOSIS — F1320 Sedative, hypnotic or anxiolytic dependence, uncomplicated: Secondary | ICD-10-CM

## 2020-09-02 DIAGNOSIS — Z905 Acquired absence of kidney: Secondary | ICD-10-CM

## 2020-09-02 DIAGNOSIS — I10 Essential (primary) hypertension: Secondary | ICD-10-CM

## 2020-09-02 DIAGNOSIS — E669 Obesity, unspecified: Secondary | ICD-10-CM

## 2020-09-02 DIAGNOSIS — F419 Anxiety disorder, unspecified: Secondary | ICD-10-CM

## 2020-09-02 DIAGNOSIS — K219 Gastro-esophageal reflux disease without esophagitis: Secondary | ICD-10-CM

## 2020-09-02 DIAGNOSIS — F331 Major depressive disorder, recurrent, moderate: Secondary | ICD-10-CM

## 2020-09-02 LAB — CMP14+EGFR
ALT: 18 IU/L (ref 0–32)
AST: 23 IU/L (ref 0–40)
Albumin/Globulin Ratio: 1.3 (ref 1.2–2.2)
Albumin: 4.3 g/dL (ref 3.8–4.9)
Alkaline Phosphatase: 100 IU/L (ref 44–121)
BUN/Creatinine Ratio: 21 (ref 9–23)
BUN: 22 mg/dL (ref 6–24)
Bilirubin Total: 0.4 mg/dL (ref 0.0–1.2)
CO2: 23 mmol/L (ref 20–29)
Calcium: 10.6 mg/dL — ABNORMAL HIGH (ref 8.7–10.2)
Chloride: 103 mmol/L (ref 96–106)
Creatinine, Ser: 1.03 mg/dL — ABNORMAL HIGH (ref 0.57–1.00)
GFR calc Af Amer: 69 mL/min/{1.73_m2} (ref >59)
GFR calc non Af Amer: 60 mL/min/{1.73_m2} (ref >59)
Globulin, Total: 3.4 g/dL (ref 1.5–4.5)
Glucose: 103 mg/dL — ABNORMAL HIGH (ref 65–99)
Potassium: 5.8 mmol/L — ABNORMAL HIGH (ref 3.5–5.2)
Sodium: 139 mmol/L (ref 134–144)
Total Protein: 7.7 g/dL (ref 6.0–8.5)

## 2020-09-02 LAB — CBC WITH DIFFERENTIAL/PLATELET
Basophils Absolute: 0.1 10*3/uL (ref 0.0–0.2)
Basos: 1 %
EOS (ABSOLUTE): 0.3 10*3/uL (ref 0.0–0.4)
Eos: 2 %
Hematocrit: 46.9 % — ABNORMAL HIGH (ref 34.0–46.6)
Hemoglobin: 15.5 g/dL (ref 11.1–15.9)
Immature Grans (Abs): 0 10*3/uL (ref 0.0–0.1)
Immature Granulocytes: 0 %
Lymphocytes Absolute: 5 10*3/uL — ABNORMAL HIGH (ref 0.7–3.1)
Lymphs: 37 %
MCH: 30.7 pg (ref 26.6–33.0)
MCHC: 33 g/dL (ref 31.5–35.7)
MCV: 93 fL (ref 79–97)
Monocytes Absolute: 1.2 10*3/uL — ABNORMAL HIGH (ref 0.1–0.9)
Monocytes: 9 %
Neutrophils Absolute: 7.1 10*3/uL — ABNORMAL HIGH (ref 1.4–7.0)
Neutrophils: 51 %
Platelets: 478 10*3/uL — ABNORMAL HIGH (ref 150–450)
RBC: 5.05 x10E6/uL (ref 3.77–5.28)
RDW: 12.6 % (ref 11.7–15.4)
WBC: 13.8 10*3/uL — ABNORMAL HIGH (ref 3.4–10.8)

## 2020-09-02 MED ORDER — CLONAZEPAM 1 MG PO TABS: 1.0000 mg | ORAL_TABLET | Freq: Two times a day (BID) | ORAL | 0 refills | Status: DC

## 2020-09-02 NOTE — Progress Notes (Signed)
Subjective:    Patient ID: Natalie Cobb, female    DOB: [birth year], 60 y.o.   MRN: [id number]  Chief Complaint  Patient presents with  . Medical Management of Chronic Issues  . Hypertension    Discuss last drug screen and Klonopin.    PTpresents  To the office for chronic follow up. 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 She has seen Hematologists and was cleared.   Her last drug screen on 06/03/20 that showed marijuana. She was told we could not continue this medication. She wants a referral to behavorial health.   Hypertension This is a chronic problem. The current episode started more than 1 year ago. The problem has been resolved since onset. The problem is controlled. Associated symptoms include anxiety and peripheral edema ("in my ankles"). Pertinent negatives include no malaise/fatigue or shortness of breath. Risk factors for coronary artery disease include dyslipidemia, obesity and sedentary lifestyle.  Asthma There is no cough, shortness of breath or wheezing. This is a chronic problem. The current episode started more than 1 year ago. The problem occurs rarely. Associated symptoms include heartburn. Pertinent negatives include no malaise/fatigue. Her past medical history is significant for asthma.  Gastroesophageal Reflux She complains of belching and heartburn. She reports no coughing or no wheezing. This is a chronic problem. The current episode started more than 1 year ago. The problem occurs occasionally. The problem has been waxing and waning. She has tried a PPI for the symptoms. The treatment provided moderate relief.  Hyperlipidemia This is a chronic problem. The current episode started more than 1 year ago. The problem is controlled. Exacerbating diseases include obesity. Pertinent negatives include no shortness of breath. Current antihyperlipidemic treatment includes statins. The current treatment provides moderate  improvement of lipids. Risk factors for coronary artery disease include dyslipidemia, hypertension and post-menopausal.  Depression        This is a chronic problem.  The current episode started more than 1 year ago.   The onset quality is gradual.   The problem occurs intermittently.  Associated symptoms include sad.  Associated symptoms include no helplessness, no hopelessness, not irritable and no restlessness.  Past medical history includes anxiety.   Anxiety Presents for follow-up visit. Symptoms include depressed mood, excessive worry and nervous/anxious behavior. Patient reports no restlessness or shortness of breath.   Her past medical history is significant for asthma.      Review of Systems  Constitutional: Negative for malaise/fatigue.  Respiratory: Negative for cough, shortness of breath and wheezing.   Gastrointestinal: Positive for heartburn.  Psychiatric/Behavioral: Positive for depression. The patient is nervous/anxious.   All other systems reviewed and are negative.      Objective:   Physical Exam Vitals reviewed.  Constitutional:      General: She is not irritable.She is not in acute distress.    Appearance: She is well-developed and well-nourished.  HENT:     Head: Normocephalic and atraumatic.     Right Ear: Tympanic membrane normal.     Left Ear: Tympanic membrane normal.     Mouth/Throat:     Mouth: Oropharynx is clear and moist.  Eyes:     Pupils: Pupils are equal, round, and reactive to light.  Neck:     Thyroid: No thyromegaly.  Cardiovascular:     Rate and Rhythm: Normal rate and regular rhythm.     Pulses: Intact distal pulses.     Heart sounds: Normal heart  sounds. No murmur heard.   Pulmonary:     Effort: Pulmonary effort is normal. No respiratory distress.     Breath sounds: Normal breath sounds. No wheezing.  Abdominal:     General: Bowel sounds are normal. There is no distension.     Palpations: Abdomen is soft.     Tenderness: There is  no abdominal tenderness.  Musculoskeletal:        General: No tenderness or edema. Normal range of motion.     Cervical back: Normal range of motion and neck supple.  Skin:    General: Skin is warm and dry.  Neurological:     Mental Status: She is alert and oriented to person, place, and time.     Cranial Nerves: No cranial nerve deficit.     Deep Tendon Reflexes: Reflexes are normal and symmetric.  Psychiatric:        Mood and Affect: Mood and affect normal.        Behavior: Behavior normal.        Thought Content: Thought content normal.        Judgment: Judgment normal.       BP 122/72   Pulse 74   Temp (!) 97.1 F (36.2 C) (Temporal)   Ht $R'5\' 3"'lH$  (1.6 m)   Wt 185 lb (83.9 kg)   BMI 32.77 kg/m      Assessment & Plan:  Lakeita L Mkrtchyan comes in today with chief complaint of Medical Management of Chronic Issues and Hypertension (Discuss last drug screen and Klonopin.)   Diagnosis and orders addressed:  1. PAF (paroxysmal atrial fibrillation) (HCC) - CMP14+EGFR - CBC with Differential/Platelet  2. Primary hypertension - CMP14+EGFR - CBC with Differential/Platelet  3. Mild intermittent asthma without complication - NUU72+ZDGU - CBC with Differential/Platelet  4. Gastroesophageal reflux disease without esophagitis - CMP14+EGFR - CBC with Differential/Platelet  5. Hyperlipidemia, unspecified hyperlipidemia type - CMP14+EGFR - CBC with Differential/Platelet  6. Moderate episode of recurrent major depressive disorder ([place]) - CMP14+EGFR - CBC with Differential/Platelet - Ambulatory referral to Psychiatry  7. H/O kidney removal - CMP14+EGFR - CBC with Differential/Platelet  8. Obesity (BMI 30-39.9) - CMP14+EGFR - CBC with Differential/Platelet  9. Benzodiazepine dependence (HCC) - CMP14+EGFR - CBC with Differential/Platelet - Ambulatory referral to Psychiatry - clonazePAM (KLONOPIN) 1 MG tablet; Take 1 tablet (1 mg total) by mouth 2 (two) times daily.   Dispense: 50 tablet; Refill: 0  10. Controlled substance agreement signed - CMP14+EGFR - CBC with Differential/Platelet - Ambulatory referral to Psychiatry - clonazePAM (KLONOPIN) 1 MG tablet; Take 1 tablet (1 mg total) by mouth 2 (two) times daily.  Dispense: 50 tablet; Refill: 0  11. Anxiety - CMP14+EGFR - CBC with Differential/Platelet - Ambulatory referral to Psychiatry - clonazePAM (KLONOPIN) 1 MG tablet; Take 1 tablet (1 mg total) by mouth 2 (two) times daily.  Dispense: 50 tablet; Refill: 0   Labs pending We will refill Klonopin one more time, while behavorial health referral is pending. Discussed not to stop abruptly and to tamper.  Health Maintenance reviewed- Will call us with Colonoscopy provider she had in [place] and exercise encouraged  Follow up plan: 6 months    Evelina Dun, FNP

## 2020-09-02 NOTE — Patient Instructions (Signed)
https://www.cancer.org/cancer/colon-rectal-cancer/causes-risks-prevention/risk-factors.html">  Colorectal Cancer Screening  Colorectal cancer screening is a group of tests that are used to check for colorectal cancer before symptoms develop. Colorectal refers to the colon and rectum. The colon and rectum are located at the end of the digestive tract and carry stool (feces) out of the body. Who should have screening? All adults who are 45-60 years old should have screening. Your health care provider may recommend screening before age 60. You will have tests every 1-10 years, depending on your results and the type of screening test. Screening recommendations for adults who are 76-85 years old vary depending on a person's health. People older than age 85 should no longer get colorectal cancer screening. You may have screening tests starting before age 60, or more often than other people, if you have any of these risk factors:  A personal or family history of colorectal cancer or abnormal growths known as polyps in your colon.  Inflammatory bowel disease, such as ulcerative colitis or Crohn's disease.  A history of having radiation treatment to the abdomen or the area between the hip bones (pelvic area) for cancer.  A type of genetic syndrome that is passed from parent to child (hereditary), such as: ? Lynch syndrome. ? Familial adenomatous polyposis. ? Turcot syndrome. ? Peutz-Jeghers syndrome. ? MUTYH-associated polyposis (MAP).  A personal history of diabetes. Types of tests There are several types of colorectal screening tests. You may have one or more of the following:  Guaiac-based fecal occult blood testing. For this test, a stool sample is checked for hidden (occult) blood, which could be a sign of colorectal cancer.  Fecal immunochemical test (FIT). For this test, a stool sample is checked for blood, which could be a sign of colorectal cancer.  Stool DNA test. For this test, a stool  sample is checked for blood and changes in DNA that could lead to colorectal cancer.  Sigmoidoscopy. During this test, a thin, flexible tube with a camera on the end, called a sigmoidoscope, is used to examine the rectum and the lower colon.  Colonoscopy. During this test, a long, flexible tube with a camera on the end, called a colonoscope, is used to examine the entire colon and rectum. Also, sometimes a tissue sample is taken to be looked at under a microscope (biopsy) or small polyps are removed during this test.  Virtual colonoscopy. Instead of a colonoscope, this type of colonoscopy uses a CT scan to take pictures of the colon and rectum. A CT scan is a type of X-ray that is made using computers. What are the benefits of screening? Screening reduces your risk for colorectal cancer and can help identify cancer at an early stage, when the cancer can be removed or treated more easily. It is common for polyps to form in the lining of the colon, especially as you age. These polyps may be cancerous or become cancerous over time. Screening can identify these polyps. What are the risks of screening? Generally, these are safe tests. However, problems may occur, including:  The need for more tests to confirm results from a stool sample test. Stool sample tests have fewer risks than other types of screening tests.  Being exposed to low levels of radiation, if you had a test involving X-rays. This may slightly increase your cancer risk. The benefit of detecting cancer outweighs the slight increase in risk.  Bleeding, damage to the intestine, or infection caused by a sigmoidoscopy or colonoscopy.  A reaction to medicines given   during a sigmoidoscopy or colonoscopy. Talk with your health care provider to understand your risk for colorectal cancer and to make a screening plan that is right for you. Questions to ask your health care provider  When should I start colorectal cancer screening?  What is my  risk for colorectal cancer?  How often do I need screening?  Which screening tests do I need?  How do I get my test results?  What do my results mean? Where to find more information Learn more about colorectal cancer screening from:  The American Cancer Society: cancer.org  National Cancer Institute: cancer.gov Summary  Colorectal cancer screening is a group of tests used to check for colorectal cancer before symptoms develop.  All adults who are 45-60 years old should have screening. Your health care provider may recommend screening before age 60.  You may have screening tests starting before age 60, or more often than other people, if you have certain risk factors.  Screening reduces your risk for colorectal cancer and can help identify cancer at an early stage, when the cancer can be removed or treated more easily.  Talk with your health care provider to understand your risk for colorectal cancer and to make a screening plan that is right for you. This information is not intended to replace advice given to you by your health care provider. Make sure you discuss any questions you have with your health care provider. Document Revised: 11/12/2019 Document Reviewed: 11/12/2019 Elsevier Patient Education  2021 Elsevier Inc.  

## 2020-09-03 ENCOUNTER — Other Ambulatory Visit: Payer: Self-pay | Admitting: Family

## 2020-09-03 DIAGNOSIS — E875 Hyperkalemia: Secondary | ICD-10-CM

## 2020-09-06 ENCOUNTER — Other Ambulatory Visit: Payer: Self-pay | Admitting: Family

## 2020-09-06 DIAGNOSIS — Z1231 Encounter for screening mammogram for malignant neoplasm of breast: Secondary | ICD-10-CM

## 2020-09-07 ENCOUNTER — Telehealth: Payer: Self-pay

## 2020-09-07 DIAGNOSIS — Z1239 Encounter for other screening for malignant neoplasm of breast: Secondary | ICD-10-CM

## 2020-09-10 ENCOUNTER — Other Ambulatory Visit: Payer: Self-pay

## 2020-09-10 DIAGNOSIS — R2231 Localized swelling, mass and lump, right upper limb: Secondary | ICD-10-CM

## 2020-09-10 NOTE — Telephone Encounter (Signed)
Patient is calling back and said she had been playing phone tag since Monday. Please call back.

## 2020-09-10 NOTE — Telephone Encounter (Signed)
Left message to call back  

## 2020-09-10 NOTE — Telephone Encounter (Signed)
Orders place - appt made - patient aware

## 2020-09-10 NOTE — Telephone Encounter (Signed)
Mammogram changed to a diagnostic mammogram with [place].

## 2020-09-10 NOTE — Telephone Encounter (Signed)
The lump is under R arm pit it is not painful no discoloration it is not hard about the size off avocado pit. No drainage. Per last message in labs.

## 2020-09-21 ENCOUNTER — Other Ambulatory Visit: Payer: Self-pay | Admitting: Family Medicine

## 2020-09-21 ENCOUNTER — Other Ambulatory Visit: Payer: Self-pay

## 2020-09-21 ENCOUNTER — Other Ambulatory Visit: Payer: No Typology Code available for payment source

## 2020-09-21 DIAGNOSIS — E875 Hyperkalemia: Secondary | ICD-10-CM

## 2020-09-22 LAB — BMP8+EGFR
BUN/Creatinine Ratio: 24 — ABNORMAL HIGH (ref 9–23)
BUN: 22 mg/dL (ref 6–24)
CO2: 21 mmol/L (ref 20–29)
Calcium: 10 mg/dL (ref 8.7–10.2)
Chloride: 100 mmol/L (ref 96–106)
Creatinine, Ser: 0.91 mg/dL (ref 0.57–1.00)
GFR calc Af Amer: 80 mL/min/{1.73_m2} (ref >59)
GFR calc non Af Amer: 69 mL/min/{1.73_m2} (ref >59)
Glucose: 96 mg/dL (ref 65–99)
Potassium: 5 mmol/L (ref 3.5–5.2)
Sodium: 137 mmol/L (ref 134–144)

## 2020-09-22 LAB — PTH, INTACT AND CALCIUM
Calcium: 10.2 mg/dL (ref 8.7–10.2)
PTH: 56 pg/mL (ref 15–65)

## 2020-09-27 ENCOUNTER — Other Ambulatory Visit: Payer: Self-pay | Admitting: Family

## 2020-09-27 DIAGNOSIS — Z79899 Other long term (current) drug therapy: Secondary | ICD-10-CM

## 2020-09-27 DIAGNOSIS — F1320 Sedative, hypnotic or anxiolytic dependence, uncomplicated: Secondary | ICD-10-CM

## 2020-09-27 DIAGNOSIS — F419 Anxiety disorder, unspecified: Secondary | ICD-10-CM

## 2020-10-05 ENCOUNTER — Ambulatory Visit (HOSPITAL_COMMUNITY)
Admission: RE | Admit: 2020-10-05 | Discharge: 2020-10-05 | Disposition: A | Discharge status: Home / Self Care | Payer: No Typology Code available for payment source | Source: Ambulatory Visit | Attending: Family | Admitting: Family

## 2020-10-05 ENCOUNTER — Other Ambulatory Visit: Payer: Self-pay

## 2020-10-05 ENCOUNTER — Encounter (HOSPITAL_COMMUNITY): Payer: Self-pay

## 2020-10-05 DIAGNOSIS — R2231 Localized swelling, mass and lump, right upper limb: Secondary | ICD-10-CM

## 2020-11-03 ENCOUNTER — Other Ambulatory Visit: Payer: Self-pay | Admitting: Family

## 2020-11-03 DIAGNOSIS — F1320 Sedative, hypnotic or anxiolytic dependence, uncomplicated: Secondary | ICD-10-CM

## 2020-11-03 DIAGNOSIS — Z79899 Other long term (current) drug therapy: Secondary | ICD-10-CM

## 2020-11-03 DIAGNOSIS — F419 Anxiety disorder, unspecified: Secondary | ICD-10-CM

## 2020-11-07 ENCOUNTER — Other Ambulatory Visit: Payer: Self-pay | Admitting: Family

## 2020-11-07 DIAGNOSIS — F1320 Sedative, hypnotic or anxiolytic dependence, uncomplicated: Secondary | ICD-10-CM

## 2020-11-07 DIAGNOSIS — Z79899 Other long term (current) drug therapy: Secondary | ICD-10-CM

## 2020-11-07 DIAGNOSIS — F419 Anxiety disorder, unspecified: Secondary | ICD-10-CM

## 2020-12-06 ENCOUNTER — Encounter: Payer: Self-pay | Admitting: Family Medicine

## 2020-12-10 ENCOUNTER — Encounter: Payer: Self-pay | Admitting: Family Medicine

## 2020-12-13 ENCOUNTER — Other Ambulatory Visit: Payer: Self-pay | Admitting: Family

## 2020-12-13 DIAGNOSIS — F419 Anxiety disorder, unspecified: Secondary | ICD-10-CM

## 2020-12-13 DIAGNOSIS — F331 Major depressive disorder, recurrent, moderate: Secondary | ICD-10-CM

## 2020-12-23 ENCOUNTER — Telehealth: Payer: Self-pay | Admitting: Family

## 2020-12-23 DIAGNOSIS — Z79899 Other long term (current) drug therapy: Secondary | ICD-10-CM

## 2020-12-23 DIAGNOSIS — F1320 Sedative, hypnotic or anxiolytic dependence, uncomplicated: Secondary | ICD-10-CM

## 2020-12-23 DIAGNOSIS — F419 Anxiety disorder, unspecified: Secondary | ICD-10-CM

## 2020-12-24 MED ORDER — CLONAZEPAM 1 MG PO TABS
1.0000 mg | ORAL_TABLET | Freq: Two times a day (BID) | ORAL | 0 refills | Status: AC
Start: 2020-12-24 — End: ?

## 2020-12-24 NOTE — Telephone Encounter (Signed)
I have sent a refill for #10. I can not refill this again.

## 2020-12-24 NOTE — Telephone Encounter (Signed)
Lmtcb ac

## 2020-12-27 ENCOUNTER — Other Ambulatory Visit: Payer: Self-pay | Admitting: Family

## 2020-12-27 DIAGNOSIS — I480 Paroxysmal atrial fibrillation: Secondary | ICD-10-CM

## 2021-01-01 ENCOUNTER — Other Ambulatory Visit: Payer: Self-pay | Admitting: Family

## 2021-01-01 DIAGNOSIS — K219 Gastro-esophageal reflux disease without esophagitis: Secondary | ICD-10-CM

## 2021-01-06 ENCOUNTER — Telehealth: Payer: Self-pay | Admitting: Family

## 2021-01-06 NOTE — Telephone Encounter (Signed)
Pt does not want to wait for mail order, she would like to use eliquis but for the price she said she will stop taking it.

## 2021-02-08 ENCOUNTER — Other Ambulatory Visit: Payer: Self-pay | Admitting: Family

## 2021-02-08 DIAGNOSIS — F331 Major depressive disorder, recurrent, moderate: Secondary | ICD-10-CM

## 2021-02-08 DIAGNOSIS — F419 Anxiety disorder, unspecified: Secondary | ICD-10-CM

## 2021-02-20 ENCOUNTER — Other Ambulatory Visit: Payer: Self-pay | Admitting: Family

## 2021-02-20 DIAGNOSIS — J4520 Mild intermittent asthma, uncomplicated: Secondary | ICD-10-CM

## 2021-03-02 ENCOUNTER — Ambulatory Visit: Payer: No Typology Code available for payment source | Admitting: Family

## 2021-03-03 ENCOUNTER — Encounter: Payer: Self-pay | Admitting: Family

## 2021-03-14 ENCOUNTER — Other Ambulatory Visit: Payer: Self-pay | Admitting: Family

## 2021-03-14 DIAGNOSIS — F419 Anxiety disorder, unspecified: Secondary | ICD-10-CM

## 2021-03-14 DIAGNOSIS — F331 Major depressive disorder, recurrent, moderate: Secondary | ICD-10-CM

## 2021-04-04 ENCOUNTER — Other Ambulatory Visit: Payer: Self-pay | Admitting: Family

## 2021-04-04 DIAGNOSIS — K219 Gastro-esophageal reflux disease without esophagitis: Secondary | ICD-10-CM

## 2021-04-04 DIAGNOSIS — Z79899 Other long term (current) drug therapy: Secondary | ICD-10-CM

## 2021-04-04 DIAGNOSIS — F1320 Sedative, hypnotic or anxiolytic dependence, uncomplicated: Secondary | ICD-10-CM

## 2021-04-04 DIAGNOSIS — F419 Anxiety disorder, unspecified: Secondary | ICD-10-CM

## 2021-04-04 DIAGNOSIS — F331 Major depressive disorder, recurrent, moderate: Secondary | ICD-10-CM

## 2021-04-12 ENCOUNTER — Telehealth: Payer: Self-pay | Admitting: Family

## 2021-04-12 ENCOUNTER — Other Ambulatory Visit: Payer: Self-pay | Admitting: Family

## 2021-04-12 DIAGNOSIS — F331 Major depressive disorder, recurrent, moderate: Secondary | ICD-10-CM

## 2021-04-12 DIAGNOSIS — F419 Anxiety disorder, unspecified: Secondary | ICD-10-CM

## 2021-04-12 MED ORDER — BUSPIRONE HCL 10 MG PO TABS: 10.0000 mg | ORAL_TABLET | Freq: Three times a day (TID) | ORAL | 0 refills | Status: AC | PRN

## 2021-04-12 NOTE — Telephone Encounter (Signed)
I can not prescribe controlled medications without visit. Last drug screen was abnormal and patient stated she was going to continue her marijuana.

## 2021-04-12 NOTE — Telephone Encounter (Signed)
Lmtcb.

## 2021-04-12 NOTE — Telephone Encounter (Signed)
  Prescription Request  04/12/2021  Is this a "Controlled Substance" medicine? yes Have you seen your PCP in the last 2 weeks? no If YES, route message to pool  -  If NO, patient needs to be scheduled for appointment.  What is the name of the medication or equipment? Klonopin  Have you contacted your pharmacy to request a refill? yes  Which pharmacy would you like this sent to? [facility] in [place]    Patient notified that their request is being sent to the clinical staff for review and that they should receive a response within 2 business days.      *Appt made for 9/20 because she will not have insurance until 9/19

## 2021-04-12 NOTE — Telephone Encounter (Signed)
Patient has an appointment scheduled on 04/26/21 with you.  She said she needs two pills before then, she has enough to last for a while, but takes like 1/4 of a pill every day or two.

## 2021-04-12 NOTE — Addendum Note (Signed)
Addended by: Evelina Dun A on: 04/12/2021 01:54 PM   Modules accepted: Orders

## 2021-04-12 NOTE — Telephone Encounter (Signed)
Patient aware and verbalized understanding. °

## 2021-04-12 NOTE — Telephone Encounter (Signed)
Buspar 10 mg TID prn Prescription sent to pharmacy. Keep chronic follow up.

## 2021-04-12 NOTE — Telephone Encounter (Signed)
Patient aware and verbalized understanding. Is there anything you can call in for panic attack? Until 09/20 she has no insurance until then. She is asking for anything that will help her get through with the panic attacks does not have to be controlled.?

## 2021-04-26 ENCOUNTER — Encounter: Payer: Self-pay | Admitting: Family

## 2021-04-26 ENCOUNTER — Ambulatory Visit: Payer: No Typology Code available for payment source | Admitting: Family

## 2021-05-04 ENCOUNTER — Other Ambulatory Visit: Payer: Self-pay | Admitting: Family

## 2021-05-04 DIAGNOSIS — K219 Gastro-esophageal reflux disease without esophagitis: Secondary | ICD-10-CM

## 2021-05-05 ENCOUNTER — Ambulatory Visit: Payer: No Typology Code available for payment source | Admitting: Family

## 2021-05-09 ENCOUNTER — Encounter: Payer: Self-pay | Admitting: Family

## 2021-05-29 ENCOUNTER — Other Ambulatory Visit: Payer: Self-pay | Admitting: Family

## 2021-05-29 DIAGNOSIS — J4520 Mild intermittent asthma, uncomplicated: Secondary | ICD-10-CM

## 2021-06-06 ENCOUNTER — Other Ambulatory Visit: Payer: Self-pay | Admitting: Family

## 2021-06-06 DIAGNOSIS — K219 Gastro-esophageal reflux disease without esophagitis: Secondary | ICD-10-CM

## 2021-06-08 ENCOUNTER — Other Ambulatory Visit: Payer: Self-pay | Admitting: Family

## 2021-06-08 DIAGNOSIS — F331 Major depressive disorder, recurrent, moderate: Secondary | ICD-10-CM

## 2021-06-08 DIAGNOSIS — F419 Anxiety disorder, unspecified: Secondary | ICD-10-CM

## 2021-06-15 ENCOUNTER — Other Ambulatory Visit: Payer: Self-pay | Admitting: Family Medicine

## 2021-06-15 DIAGNOSIS — Z1231 Encounter for screening mammogram for malignant neoplasm of breast: Secondary | ICD-10-CM

## 2021-07-11 ENCOUNTER — Other Ambulatory Visit: Payer: Self-pay | Admitting: Family

## 2021-07-11 DIAGNOSIS — F419 Anxiety disorder, unspecified: Secondary | ICD-10-CM

## 2021-07-11 DIAGNOSIS — F331 Major depressive disorder, recurrent, moderate: Secondary | ICD-10-CM

## 2021-07-15 ENCOUNTER — Other Ambulatory Visit: Payer: Self-pay | Admitting: Family

## 2021-07-15 DIAGNOSIS — F331 Major depressive disorder, recurrent, moderate: Secondary | ICD-10-CM

## 2021-07-15 DIAGNOSIS — F419 Anxiety disorder, unspecified: Secondary | ICD-10-CM

## 2021-07-18 ENCOUNTER — Other Ambulatory Visit: Payer: Self-pay | Admitting: Family

## 2021-08-08 ENCOUNTER — Other Ambulatory Visit: Payer: Self-pay | Admitting: Family

## 2021-08-08 DIAGNOSIS — E785 Hyperlipidemia, unspecified: Secondary | ICD-10-CM

## 2021-08-27 DIAGNOSIS — R5383 Other fatigue: Secondary | ICD-10-CM | POA: Diagnosis not present

## 2021-10-10 NOTE — Progress Notes (Signed)
?Cardiology Office Note:   ? ?Date:  10/11/2021  ? ?ID:  Natalie Cobb, DOB [birth year], MRN [id number] ? ?PCP:  [facility], Patrick ?  ?[facility] Prov[facility] ?Cardiologist:  Werner Lean, MD    ? ?Referring MD: Sheran Spine, FNP  ? ?CC: AF ?Consulted for the evaluation of AF at the behest of [name] ? ?History of Present Illness:   ? ?Natalie Cobb is a 61 y.o. female with a hx of PAF (2015, found after R ankle surgery), HTN, HLD, CHADSVASC of 2 on eliquis, rare marijuana use,  who presents to establish care 10/11/21. ? ?Patient notes that she is feeling new palpitations.  Patient has significant anxiety was treated on clonopin.  She occasional smokes marijuana, and after a positive urine test her medications were stopped.  This increased her anxiety and her palpitations.  She lost her job which made things worse.  Since this she has had an improvement in everything ? ?Stress has improved on medications and with improvement in lift events.  She is back to work and had insurance.  She has no prior issues with Eliquis, and is hoping to start back.  She has no more palpitations and no SOB.  No CP.  No syncope.   ?  ?2015 Echo Normal LV function and atrial size. ? ? ?Past Medical History:  ?Diagnosis Date  ? Anxiety   ? Asthma   ? Depression   ? Diverticulitis   ? GERD (gastroesophageal reflux disease)   ? Hernia of abdominal wall   ? Hypertension   ? IBS (irritable bowel syndrome)   ? PAF (paroxysmal atrial fibrillation) ([place])   ? Thrombocytosis   ? 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 Nephrectomy 1963, chemotherapy and radiation [facility]  ? ? ?Past Surgical History:  ?Procedure Laterality Date  ? Left nephrectomy  1963  ? LYMPH GLAND EXCISION    ? neck in 20's  ? ORIF ANKLE FRACTURE Right 10/18/2013  ? Procedure: OPEN REDUCTION INTERNAL FIXATION (ORIF) RIGHT TRIMALLEOLAR ANKLE FRACTURE;  Surgeon: Newt Minion, MD;  Location: [facility];  Service:  Orthopedics;  Laterality: Right;  ? REVISION OF SCAR    ? of abdominal scar age 59  ? Splenectomy  1963  ? TUBAL LIGATION    ? WISDOM TOOTH EXTRACTION    ? ? ?Current Medications: ?Current Meds  ?Medication Sig  ? albuterol (PROAIR HFA) 108 (90 Base) MCG/ACT inhaler Inhale 2 puffs into the lungs every 6 (six) hours as needed for wheezing or shortness of breath.  ? amLODipine (NORVASC) 5 MG tablet Take 1 tablet (5 mg total) by mouth daily.  ? busPIRone (BUSPAR) 10 MG tablet Take 1 tablet (10 mg total) by mouth 3 (three) times daily as needed.  ? Cholecalciferol (VITAMIN D-3) 5000 UNITS TABS Take 5,000 Units by mouth every evening.   ? clonazePAM (KLONOPIN) 1 MG tablet Take 1 tablet (1 mg total) by mouth 2 (two) times daily.  ? ELIQUIS 5 MG TABS tablet TAKE 1 TABLET BY MOUTH TWICE A DAY  ? metoprolol tartrate (LOPRESSOR) 50 MG tablet Take 1 tablet (50 mg total) by mouth 2 (two) times daily.  ? montelukast (SINGULAIR) 10 MG tablet TAKE 1 TABLET BY MOUTH EVERYDAY AT BEDTIME  ? pantoprazole (PROTONIX) 40 MG tablet TAKE 1 TABLET (40 MG TOTAL) BY MOUTH DAILY. (NEEDS TO BE SEEN BEFORE NEXT REFILL)  ? simvastatin (ZOCOR) 40  MG tablet Take 1 tablet (40 mg total) by mouth daily at 6 PM.  ? venlafaxine XR (EFFEXOR-XR) 150 MG 24 hr capsule Take 1 capsule (150 mg total) by mouth daily with breakfast.  ?  ? ?Allergies:   Compazine [prochlorperazine edisylate], Meclizine, Thorazine [chlorpromazine], and Sulfa antibiotics  ? ?Social History  ? ?Socioeconomic History  ? Marital status: Legally Separated  ?  Spouse name: Not on file  ? Number of children: Not on file  ? Years of education: Not on file  ? Highest education level: Not on file  ?Occupational History  ? Not on file  ?Tobacco Use  ? Smoking status: Former  ?  Years: 10.00  ?  Types: Cigarettes  ? Smokeless tobacco: Never  ?Vaping Use  ? Vaping Use: Not on file  ?Substance and Sexual Activity  ? Alcohol use: Yes  ?  Comment: rarely  ? Drug use: Yes  ?  Types: Marijuana  ?  Sexual activity: Not Currently  ?  Birth control/protection: Surgical  ?  Comment: Quit around 2010  ?Other Topics Concern  ? Not on file  ?Social History Narrative  ? Not on file  ? ?Social Determinants of Health  ? ?Financial Resource Strain: Not on file  ?Food Insecurity: Not on file  ?Transportation Needs: Not on file  ?Physical Activity: Not on file  ?Stress: Not on file  ?Social Connections: Not on file  ?  ? ?Family History: ?The patient's family history includes Brain cancer in her paternal grandmother; Pulmonary fibrosis in her father; Stroke in her mother. ? ?ROS:   ?Please see the history of present illness.    ? All other systems reviewed and are negative. ? ?EKGs/Labs/Other Studies Reviewed:   ? ?The following studies were reviewed today: ? ?EKG:  EKG is  ordered today.  The ekg ordered today demonstrates  ?10/11/21: SR rate 69 with inferolateral TWI ?Recent Labs: ?No results found for requested labs within last 8760 hours.  ?Recent Lipid Panel ?   ?Component Value Date/Time  ? CHOL 181 06/03/2020 0910  ? TRIG 123 06/03/2020 0910  ? HDL 41 06/03/2020 0910  ? CHOLHDL 4.4 06/03/2020 0910  ? LDLCALC 118 (H) 06/03/2020 0910  ? ?Risk Assessment/Calculations:   ? ?CHA2DS2-VASc Score = 2  ? This indicates a 2.2% annual risk of stroke. ?The patient's score is based upon: ?CHF History: 0 ?HTN History: 1 ?Diabetes History: 0 ?Stroke History: 0 ?Vascular Disease History: 0 ?Age Score: 0 ?Gender Score: 1 ?  ?  ?    ? ?Physical Exam:   ? ?VS:  BP (!) 166/104   Pulse 70   Ht '5\' 3"'$  (1.6 m)   Wt 196 lb 4.8 oz (89 kg)   SpO2 96%   BMI 34.77 kg/m?    ? ?Wt Readings from Last 3 Encounters:  ?10/11/21 196 lb 4.8 oz (89 kg)  ?09/02/20 185 lb (83.9 kg)  ?06/03/20 192 lb 3.2 oz (87.2 kg)  ?  ?Gen: No distress,   ?Neck: No JVD  ?Cardiac: No Rubs or Gallops, no murmur, RRR, +2 radial pulses ?Respiratory: Clear to auscultation bilaterally, normal effort, normal  respiratory rate ?GI: Soft, nontender, non-distended  ?MS: No  edema;  moves all extremities ?Integument: Skin feels warm ?Neuro:  At time of evaluation, alert and oriented to person/place/time/situation  ?Psych: Normal affect, patient feels well ? ? ?ASSESSMENT:   ? ?1. PAF (paroxysmal atrial fibrillation) ([place])   ?2. Essential hypertension   ?3. Mixed hyperlipidemia   ? ?  PLAN:   ? ?Paroxysmal Atrial Fibrillation ?HTN ?HLD ?- Risk factors include HTN and gender ?- CHADSVASC=2. ?- discussed ambulatory BP monitoring: ?- will restart anticoagulation with eliquis; will get CBC in 2.5 weeks ?- Continue rate control with metoprolol 50 mg PO BID ?- continue simvastatin, LDL goal < 100 ? ?Discussed Marijuana Cessation and that CYP interactions can be associated with inappropriate metabolism of  ?- Simvastatin ?- BB ?- discussed 3.3 X higher risk of stroke/vascular disease with marijuana use ?Margaretha Sheffield 01/12/21) ? ?Change provider to Dr. Gasper Sells ? ?One year with me unless new issues ? ? ? ? ? ?Medication Adjustments/Labs and Tests Ordered: ?Current medicines are reviewed at length with the patient today.  Concerns regarding medicines are outlined above.  ?Orders Placed This Encounter  ?Procedures  ? CBC  ? EKG 12-Lead  ? ?Meds ordered this encounter  ?Medications  ? amLODipine (NORVASC) 5 MG tablet  ?  Sig: Take 1 tablet (5 mg total) by mouth daily.  ?  Dispense:  180 tablet  ?  Refill:  3  ? ? ?Patient Instructions  ?Medication Instructions:  ?Start Amlodipine 5 mg tablets daily ?Start Eliquis 5 mg tablets twice daily ?Labwork: ?In 2 weeks: ?CBC ? ?Follow-Up: ?Follow up with Dr. Gasper Sells in 1 year.  ? ?Any Other Special Instructions Will Be Listed Below (If Applicable). ? ? ? ? ?If you need a refill on your cardiac medications before your next appointment, please call your pharmacy. ?  ? ?Signed, ?Werner Lean, MD  ?10/11/2021 2:21 PM    ?[place] ? ?

## 2021-10-11 ENCOUNTER — Other Ambulatory Visit: Payer: Self-pay | Admitting: *Deleted

## 2021-10-11 ENCOUNTER — Ambulatory Visit (INDEPENDENT_AMBULATORY_CARE_PROVIDER_SITE_OTHER): Payer: No Typology Code available for payment source | Admitting: Internal Medicine

## 2021-10-11 ENCOUNTER — Other Ambulatory Visit: Payer: Self-pay

## 2021-10-11 ENCOUNTER — Encounter: Payer: Self-pay | Admitting: Internal Medicine

## 2021-10-11 VITALS — BP 166/104 | HR 70 | Ht 63.0 in | Wt 196.3 lb

## 2021-10-11 DIAGNOSIS — I10 Essential (primary) hypertension: Secondary | ICD-10-CM

## 2021-10-11 DIAGNOSIS — E782 Mixed hyperlipidemia: Secondary | ICD-10-CM | POA: Diagnosis not present

## 2021-10-11 DIAGNOSIS — I480 Paroxysmal atrial fibrillation: Secondary | ICD-10-CM

## 2021-10-11 DIAGNOSIS — Z79899 Other long term (current) drug therapy: Secondary | ICD-10-CM

## 2021-10-11 MED ORDER — APIXABAN 5 MG PO TABS: 5.0000 mg | ORAL_TABLET | Freq: Two times a day (BID) | ORAL | 1 refills | Status: DC

## 2021-10-11 MED ORDER — AMLODIPINE BESYLATE 5 MG PO TABS: 5.0000 mg | ORAL_TABLET | Freq: Every day | ORAL | 3 refills | Status: AC

## 2021-10-11 NOTE — Telephone Encounter (Addendum)
Prescription refill request for Eliquis received. ?Indication: PAF ?Last office visit: 10/11/21  Chandrasekar,M  MD ?Scr: 0.91 on 09/21/20 ?Age: 61 ?Weight: 89kg ? ?Based on above findings Eliquis '5mg'$  twice daily is the appropriate dose.  Refill sent in. ?

## 2021-10-11 NOTE — Patient Instructions (Signed)
Medication Instructions:  ?Start Amlodipine 5 mg tablets daily ?Start Eliquis 5 mg tablets twice daily ?Labwork: ?In 2 weeks: ?CBC ? ?Follow-Up: ?Follow up with Dr. Gasper Sells in 1 year.  ? ?Any Other Special Instructions Will Be Listed Below (If Applicable). ? ? ? ? ?If you need a refill on your cardiac medications before your next appointment, please call your pharmacy. ? ?

## 2021-10-11 NOTE — Addendum Note (Signed)
Addended by: Christella Scheuermann C on: 10/11/2021 02:44 PM ? ? Modules accepted: Orders ? ?

## 2021-10-14 ENCOUNTER — Telehealth: Payer: Self-pay

## 2021-10-14 DIAGNOSIS — I480 Paroxysmal atrial fibrillation: Secondary | ICD-10-CM

## 2021-10-14 DIAGNOSIS — Z9189 Other specified personal risk factors, not elsewhere classified: Secondary | ICD-10-CM

## 2021-10-14 DIAGNOSIS — I10 Essential (primary) hypertension: Secondary | ICD-10-CM

## 2021-10-14 NOTE — Telephone Encounter (Signed)
Received a prescriber response form for pt with concerns for amlodipine and simvastatin dose.  Dr. Gasper Sells would like pt to decrease simvastatin to 20 mg PO QD.  Called pt left a message to call our office.   ?

## 2021-10-24 NOTE — Telephone Encounter (Signed)
Left a 2nd message for pt to call back.  ?

## 2021-10-26 MED ORDER — SIMVASTATIN 20 MG PO TABS: 20.0000 mg | ORAL_TABLET | Freq: Every day | ORAL | 3 refills | Status: DC

## 2021-10-26 NOTE — Addendum Note (Signed)
Addended by: Berlinda Last on: 10/26/2021 03:26 PM ? ? Modules accepted: Orders ? ?

## 2021-10-26 NOTE — Telephone Encounter (Signed)
Spoke to pt advised to decrease simvastatin to 20 mg PO QD.  This is d/t [facility]   Pt verbalizes understanding. Pt reports takes vitamin D-3 5000units X3 PO QD d/t increased calcium levels.  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  Pt wants to inform MD feels that has sleep apnea.  Has been told that stops breathing while sleep and sleeps sitting up in recliner d/t fear of sleep apnea.  Spoke with MD who advised to order a sleep study for pt.  Will route to MD to follow up.    ?

## 2021-10-26 NOTE — Telephone Encounter (Signed)
Per Dr. Gasper Sells, pt can be referred to Pulmonology for sleep study as [place] office is not yet set up for [facility]. Referral to pulmonology has been entered.  ?

## 2021-10-26 NOTE — Telephone Encounter (Signed)
Assessment based on index visit and new symptoms from phone call ? ?Do you snore loudly? ?Louder than talking or loud enough to be heard through closed doors ?Yes+1 ?Do you often feel tired, fatigued, or sleepy during the daytime? ?Yes+1 ?Has anyone observed you stop breathing during sleep? ?Yes+1 ?Do you have (or are you being treated for) high blood pressure? ?Yes+1 ?Age Greater than 65 ?Yes+1 ? ?STOPBANG of 5. Has HTN and PAF.  Sleep study is warranted. ? ?Will send for HSS ? ?

## 2021-10-26 NOTE — Addendum Note (Signed)
Addended by: Precious Gilding on: 10/26/2021 08:49 AM ? ? Modules accepted: Orders ? ?

## 2021-10-26 NOTE — Telephone Encounter (Signed)
error 

## 2021-10-26 NOTE — Telephone Encounter (Signed)
Staff message sent to Dr. Gasper Sells  ?

## 2021-11-10 DIAGNOSIS — M25511 Pain in right shoulder: Secondary | ICD-10-CM | POA: Diagnosis not present

## 2021-11-10 DIAGNOSIS — F411 Generalized anxiety disorder: Secondary | ICD-10-CM | POA: Diagnosis not present

## 2021-11-10 DIAGNOSIS — L720 Epidermal cyst: Secondary | ICD-10-CM | POA: Diagnosis not present

## 2021-12-05 ENCOUNTER — Ambulatory Visit (INDEPENDENT_AMBULATORY_CARE_PROVIDER_SITE_OTHER): Payer: No Typology Code available for payment source | Admitting: Pulmonary Disease

## 2021-12-05 ENCOUNTER — Encounter: Payer: Self-pay | Admitting: Pulmonary Disease

## 2021-12-05 VITALS — BP 132/84 | HR 74 | Temp 98.7°F | Ht 63.0 in | Wt 199.0 lb

## 2021-12-05 DIAGNOSIS — R0681 Apnea, not elsewhere classified: Secondary | ICD-10-CM | POA: Diagnosis not present

## 2021-12-05 DIAGNOSIS — J4520 Mild intermittent asthma, uncomplicated: Secondary | ICD-10-CM

## 2021-12-05 NOTE — Patient Instructions (Signed)
?  X home sleep study ?

## 2021-12-05 NOTE — Assessment & Plan Note (Signed)
Appears to be mild intermittent. ?Use albuterol on an as-needed basis ?

## 2021-12-05 NOTE — Progress Notes (Signed)
? ?Subjective:  ? ? Patient ID: Natalie Cobb, female    DOB: Oct 28, 1960, 61 y.o.   MRN: [id number] ? ?HPI ? ?Chief Complaint  ?Patient presents with  ? Consult  ?  Patient thinks she may have sleep apnea. Witnessed apneas  ? ? 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 ? ? 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 ?Loud snoring has been noted by family members including ex husband and kids.  She reports daytime fatigue and nonrefreshing sleep.  She reports episodes where she would dream of being unable to breathe and actually found herself dyspneic when waking up from sleep. ?Epworth sleepiness score is 11 she reports sleepiness as a passenger in a car or sometimes in the afternoon.  She works from home and is able to get through her workday. ?Bedtime is between 10 and 11 PM, sleep latency can be as long as 30 minutes, she generally sleeps on her stomach but does roll over on her back and finds it difficult to breathe and on her back, reports 2-3 nocturnal awakenings, out of bed around 6:30 AM feeling rested without dryness of mouth or headaches. ?Her weight is fluctuated to within 20 pounds ?There is no history suggestive of cataplexy, sleep paralysis or parasomnias ?She denies excessive caffeinated beverages ? ?Hypertension has fluctuated on 2 medications asthma is controlled and she uses albuterol about twice a week during allergy season ? ? ? ?Past Medical History:  ?Diagnosis Date  ? Anxiety   ? Asthma   ? Depression   ? Diverticulitis   ? GERD (gastroesophageal reflux disease)   ? Hernia of abdominal wall   ? Hypertension   ? IBS (irritable bowel syndrome)   ? PAF (paroxysmal atrial fibrillation) ([place])   ? Thrombocytosis   ? 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 Wilm's tumor of left kidney ([place])   ? Nephrectomy 1963, chemotherapy and radiation [facility]  ? ? ?Past Surgical History:  ?Procedure Laterality Date  ? Left nephrectomy  1963  ? LYMPH GLAND EXCISION    ? neck in 20's  ? ORIF ANKLE FRACTURE Right 61/14/2015  ? Procedure: OPEN REDUCTION INTERNAL FIXATION (ORIF) RIGHT TRIMALLEOLAR ANKLE FRACTURE;  Surgeon: Newt Minion, MD;  Location: [place];  Service: Orthopedics;  Laterality: Right;  ? REVISION OF SCAR    ? of abdominal scar age 61  ? Splenectomy  1963  ? TUBAL LIGATION    ? WISDOM TOOTH EXTRACTION    ? ? ?Allergies  ?Allergen Reactions  ? Compazine [Prochlorperazine Edisylate] Other (See Comments)  ?  Patient has "muscular" reaction to all medications that end in "ZINE"  ? Meclizine   ?  Patient has "muscular" reaction to all medications that end in "ZINE"  ? Thorazine [Chlorpromazine] Other (See Comments)  ?  Patient has "muscular" reaction to all medications that end in "ZINE"  ? Sulfa Antibiotics Rash  ? ? ?Social History  ? ?Socioeconomic History  ? Marital status: Legally Separated  ?  Spouse name: Not on file  ? Number of children: Not on file  ? Years of education: Not on file  ? Highest education level: Not on file  ?Occupational History  ? Not on file  ?Tobacco Use  ? Smoking status: Former  ?  Years: 10.00  ?  Types: Cigarettes  ? Smokeless tobacco: Never  ?Vaping Use  ? Vaping  Use: Not on file  ?Substance and Sexual Activity  ? Alcohol use: Yes  ?  Comment: rarely  ? Drug use: Yes  ?  Types: Marijuana  ? Sexual activity: Not Currently  ?  Birth control/protection: Surgical  ?  Comment: Quit around 2010  ?Other Topics Concern  ? Not on file  ?Social History Narrative  ? Not on file  ? ?Social Determinants of Health  ? ?Financial Resource Strain: Not on file  ?Food Insecurity: Not on file  ?Transportation Needs: Not on file  ?Physical Activity: Not on file  ?Stress: Not on file  ?Social Connections: Not on file  ?Intimate Partner Violence: Not on file  ? ?Family History  ?Problem Relation Age of Onset  ? Stroke Mother   ?  Pulmonary fibrosis Father   ? Brain cancer Paternal Grandmother   ? ? ?Review of Systems ? ?Constitutional: negative for anorexia, fevers and sweats  ?Eyes: negative for irritation, redness and visual disturbance  ?Ears, nose, mouth, throat, and face: negative for earaches, epistaxis, nasal congestion and sore throat  ?Respiratory: negative for cough, dyspnea on exertion, sputum and wheezing  ?Cardiovascular: negative for chest pain, dyspnea, lower extremity edema, orthopnea, palpitations and syncope  ?Gastrointestinal: negative for abdominal pain, constipation, diarrhea, melena, nausea and vomiting  ?Genitourinary:negative for dysuria, frequency and hematuria  ?Hematologic/lymphatic: negative for bleeding, easy bruising and lymphadenopathy  ?Musculoskeletal:negative for arthralgias, muscle weakness and stiff joints  ?Neurological: negative for coordination problems, gait problems, headaches and weakness  ?Endocrine: negative for diabetic symptoms including polydipsia, polyuria and weight loss ? ?   ?Objective:  ? Physical Exam ? ?Gen. Pleasant, obese, in no distress, normal affect ?ENT - no pallor,icterus, no post nasal drip, class 2-3 airway ?Neck: No JVD, no thyromegaly, no carotid bruits ?Lungs: no use of accessory muscles, no dullness to percussion, decreased without rales or rhonchi  ?Cardiovascular: Rhythm regular, heart sounds  normal, no murmurs or gallops, no peripheral edema ?Abdomen: soft and non-tender, no hepatosplenomegaly, BS normal. ?Musculoskeletal: No deformities, no cyanosis or clubbing ?Neuro:  alert, non focal, no tremors ? ? ? ?   ?Assessment & Plan:  ? ? ?OSA -  Given excessive daytime somnolence, narrow pharyngeal exam,& loud snoring, obstructive sleep apnea is possible & an overnight polysomnogram will be scheduled as a split study. The pathophysiology of obstructive sleep apnea , it's cardiovascular consequences & modes of treatment including CPAP were discused with the patient in detail &  they evidenced understanding. ?Red flags include episodes of dyspnea during sleep, loud snoring and pharyngeal exam.  She would be willing to use dental appliance or CPAP if needed ? ? ? ? ?

## 2022-02-01 DIAGNOSIS — E6609 Other obesity due to excess calories: Secondary | ICD-10-CM | POA: Diagnosis not present

## 2022-03-20 DIAGNOSIS — E668 Other obesity: Secondary | ICD-10-CM | POA: Diagnosis not present

## 2022-04-14 ENCOUNTER — Other Ambulatory Visit: Payer: Self-pay | Admitting: Internal Medicine

## 2022-04-14 DIAGNOSIS — I480 Paroxysmal atrial fibrillation: Secondary | ICD-10-CM

## 2022-04-14 NOTE — Telephone Encounter (Signed)
Prescription refill request for Eliquis received. Indication:Afib Last office visit:3/23 Scr:0.9 Age: 61 Weight:90.3 kg  Prescription refilled

## 2022-05-29 DIAGNOSIS — F411 Generalized anxiety disorder: Secondary | ICD-10-CM | POA: Diagnosis not present

## 2022-10-17 NOTE — Progress Notes (Deleted)
Cardiology Office Note:    Date:  10/17/2022   ID:  Natalie Cobb, DOB 10-19-1960, MRN [id number]  PCP:  [facility] [place] [facility] Cardiologist:  Werner Lean, MD     Referring MD: [facility]*   CC: AF  History of Present Illness:    Natalie Cobb is a 62 y.o. female with a hx of PAF (2015, found after R ankle surgery), HTN, HLD, CHADSVASC of 2 on eliquis, rare marijuana use,  who presents to establish care 10/11/21. 2015 Echo Normal LV function and atrial size. 2023: started back on DOAC, BP meds, and HLD  Patient notes that she is doing ***.   Since last visit notes *** . There are no*** interval hospital/ED visit.    No chest pain or pressure ***.  No SOB/DOE*** and no PND/Orthopnea***.  No weight gain or leg swelling***.  No palpitations or syncope ***.  Ambulatory blood pressure ***.    Past Medical History:  Diagnosis Date   Anxiety    Asthma    Depression    Diverticulitis    GERD (gastroesophageal reflux disease)    Hernia of abdominal wall    Hypertension    IBS (irritable bowel syndrome)    PAF (paroxysmal atrial fibrillation) ([place])    Thrombocytosis    Splenectomy 1963   Wilm's tumor of left kidney ([place])    Nephrectomy 1963, chemotherapy and radiation [facility]    Past Surgical History:  Procedure Laterality Date   Left nephrectomy  1963   LYMPH GLAND EXCISION     neck in 20's   ORIF ANKLE FRACTURE Right 10/18/2013   Procedure: OPEN REDUCTION INTERNAL FIXATION (ORIF) RIGHT TRIMALLEOLAR ANKLE FRACTURE;  Surgeon: Newt Minion, MD;  Location: [place];  Service: Orthopedics;  Laterality: Right;   REVISION OF SCAR     of abdominal scar age 78   Splenectomy  1963   TUBAL LIGATION     WISDOM TOOTH EXTRACTION      Current Medications: No outpatient medications have been marked as taking for the 10/20/22 encounter (Appointment) with Werner Lean, MD.      Allergies:   Compazine [prochlorperazine edisylate], Meclizine, Thorazine [chlorpromazine], and Sulfa antibiotics   Social History   Socioeconomic History   Marital status: Legally Separated    Spouse name: Not on file   Number of children: Not on file   Years of education: Not on file   Highest education level: Not on file  Occupational History   Not on file  Tobacco Use   Smoking status: Former    Years: 10.00    Types: Cigarettes   Smokeless tobacco: Never  Vaping Use   Vaping Use: Not on file  Substance and Sexual Activity   Alcohol use: Yes    Comment: rarely   Drug use: Yes    Types: Marijuana   Sexual activity: Not Currently    Birth control/protection: Surgical    Comment: Quit around 2010  Other Topics Concern   Not on file  Social History Narrative   Not on file   Social Determinants of Health   Financial Resource Strain: Not on file  Food Insecurity: Not on file  Transportation Needs: Not on file  Physical Activity: Not on file  Stress: Not on file  Social Connections: Not on file     Family History: The patient's family history includes Brain cancer in her paternal grandmother; Pulmonary  fibrosis in her father; Stroke in her mother.  ROS:   Please see the history of present illness.     All other systems reviewed and are negative.  EKGs/Labs/Other Studies Reviewed:    The following studies were reviewed today:  EKG:  EKG is  ordered today.  The ekg ordered today demonstrates  10/11/21: SR rate 69 with inferolateral TWI  Recent Labs: No results found for requested labs within last 365 days.  Recent Lipid Panel    Component Value Date/Time   CHOL 181 06/03/2020 0910   TRIG 123 06/03/2020 0910   HDL 41 06/03/2020 0910   CHOLHDL 4.4 06/03/2020 0910   LDLCALC 118 (H) 06/03/2020 0910   Risk Assessment/Calculations:    CHA2DS2-VASc Score =     This indicates a  % annual risk of stroke. The patient's score is based upon:             Physical Exam:    VS:  There were no vitals taken for this visit.    Wt Readings from Last 3 Encounters:  12/05/21 199 lb (90.3 kg)  10/11/21 196 lb 4.8 oz (89 kg)  09/02/20 185 lb (83.9 kg)    Gen: *** distress, *** obese/well nourished/malnourished   Neck: No JVD, *** carotid bruit Ears: *** Frank Sign Cardiac: No Rubs or Gallops, *** Murmur, ***cardia, *** radial pulses Respiratory: Clear to auscultation bilaterally, *** effort, ***  respiratory rate GI: Soft, nontender, non-distended *** MS: No *** edema; *** moves all extremities Integument: Skin feels *** Neuro:  At time of evaluation, alert and oriented to person/place/time/situation *** Psych: Normal affect, patient feels ***  ASSESSMENT:    No diagnosis found.  PLAN:    Paroxysmal Atrial Fibrillation HTN HLD - Risk factors include HTN and gender - CHADSVASC=2. - discussed ambulatory BP monitoring: - will restart anticoagulation with eliquis; will get CBC in 2.5 weeks - Continue rate control with metoprolol 50 mg PO BID - continue simvastatin, LDL goal < 100  Discussed Marijuana Cessation and that CYP interactions can be associated with inappropriate metabolism of  - Simvastatin - BB - discussed 3.3 X higher risk of stroke/vascular disease with marijuana use ([facility] 01/12/21)        Medication Adjustments/Labs and Tests Ordered: Current medicines are reviewed at length with the patient today.  Concerns regarding medicines are outlined above.  No orders of the defined types were placed in this encounter.  No orders of the defined types were placed in this encounter.   There are no Patient Instructions on file for this visit.   Signed, Werner Lean, MD  10/17/2022 7:34 PM    [place]

## 2022-10-20 ENCOUNTER — Ambulatory Visit: Payer: No Typology Code available for payment source | Admitting: Internal Medicine

## 2022-10-23 DIAGNOSIS — Z7901 Long term (current) use of anticoagulants: Secondary | ICD-10-CM | POA: Diagnosis not present

## 2022-10-23 DIAGNOSIS — E785 Hyperlipidemia, unspecified: Secondary | ICD-10-CM | POA: Diagnosis not present

## 2022-10-23 DIAGNOSIS — Z86010 Personal history of colonic polyps: Secondary | ICD-10-CM | POA: Diagnosis not present

## 2022-10-23 DIAGNOSIS — Z0000 Encounter for general adult medical examination without abnormal findings: Secondary | ICD-10-CM | POA: Diagnosis not present

## 2022-10-23 DIAGNOSIS — J4520 Mild intermittent asthma, uncomplicated: Secondary | ICD-10-CM | POA: Diagnosis not present

## 2022-10-23 DIAGNOSIS — R0681 Apnea, not elsewhere classified: Secondary | ICD-10-CM | POA: Diagnosis not present

## 2022-10-23 DIAGNOSIS — E6609 Other obesity due to excess calories: Secondary | ICD-10-CM | POA: Diagnosis not present

## 2022-10-23 DIAGNOSIS — K219 Gastro-esophageal reflux disease without esophagitis: Secondary | ICD-10-CM | POA: Diagnosis not present

## 2022-10-23 DIAGNOSIS — I10 Essential (primary) hypertension: Secondary | ICD-10-CM | POA: Diagnosis not present

## 2022-10-23 DIAGNOSIS — F411 Generalized anxiety disorder: Secondary | ICD-10-CM | POA: Diagnosis not present

## 2022-10-23 DIAGNOSIS — Z8679 Personal history of other diseases of the circulatory system: Secondary | ICD-10-CM | POA: Diagnosis not present

## 2022-10-23 DIAGNOSIS — Z9081 Acquired absence of spleen: Secondary | ICD-10-CM | POA: Diagnosis not present

## 2022-11-30 NOTE — Progress Notes (Deleted)
Office Visit    Patient Name: Natalie Cobb Date of Encounter: 11/30/2022  PCP:  [facility]  Cardiologist:  Christell Constant, MD  Advanced Practice Provider:  No care team member to display Electrophysiologist:  None   HPI    Natalie Cobb is a 62 y.o. female with a past medical history of PAF (2015, found to have after right ankle surgery), hypertension, hyperlipidemia, CHA2DS2-VASc score of 2 on Eliquis, rare marijuana use presents today for follow-up appointment.  Patient notes that she was feeling new palpitations at her last follow-up appointment 10/11/2021.  She had significant anxiety and was treated on Klonopin.  Occasionally smoked marijuana after a positive urine test her medications were stopped.  This increased her anxiety and her palpitations.  She lost her job which made things worse.  Since then she has improved and everything.  Stress has improved on medications and improved in her life events.  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  She has had no prior issues with Eliquis and was told that he needs to start back on it.  She had no more palpitations and no shortness of breath at the time of her last appointment.  No chest pain or syncope.  2015 echo with normal LVEF and atrial size.  Today, she***  Past Medical History    Past Medical History:  Diagnosis Date   Anxiety    Asthma    Depression    Diverticulitis    GERD (gastroesophageal reflux disease)    Hernia of abdominal wall    Hypertension    IBS (irritable bowel syndrome)    PAF (paroxysmal atrial fibrillation) ([facility])    Thrombocytosis    Splenectomy 1963   Wilm's tumor of left kidney ([facility])    Nephrectomy 1963, chemotherapy and radiation [facility]   Past Surgical History:  Procedure Laterality Date   Left nephrectomy  1963   LYMPH GLAND EXCISION     neck in 20's   ORIF ANKLE FRACTURE Right 10/18/2013    Procedure: OPEN REDUCTION INTERNAL FIXATION (ORIF) RIGHT TRIMALLEOLAR ANKLE FRACTURE;  Surgeon: Nadara Mustard, MD;  [facility];  Service: Orthopedics;  Laterality: Right;   REVISION OF SCAR     of abdominal scar age 21   Splenectomy  1963   TUBAL LIGATION     WISDOM TOOTH EXTRACTION      Allergies  Allergies  Allergen Reactions   Compazine [Prochlorperazine Edisylate] Other (See Comments)    Patient has "muscular" reaction to all medications that end in "ZINE"   Meclizine     Patient has "muscular" reaction to all medications that end in "ZINE"   Thorazine [Chlorpromazine] Other (See Comments)    Patient has "muscular" reaction to all medications that end in "ZINE"   Sulfa Antibiotics Rash    EKGs/Labs/Other Studies Reviewed:   The following studies were reviewed today:  Echo 11/06/2013      Study Conclusions   Left ventricle: The cavity size was normal. Wall thickness  was normal. Systolic function was normal. The estimated  ejection fraction was in the range of 55% to 60%. Wall  motion was normal; there were no regional wall motion  abnormalities. Doppler parameters are consistent with  abnormal left ventricular relaxation (grade 1 diastolic  dysfunction).              Transthoracic echocardiography.  M-mode, complete 2D, spectral Doppler,  and color Doppler.  Height:  Height: 160cm. Height: 63in.  Weight:  Weight:  80.5kg. Weight: 177lb.  Body mass index:  BMI: 31.4kg/m^2.  Body surface area:    BSA: 1.24m^2.  Patient status:  Inpatient.  Location:  Bedside.   ------------------------------------------------------------   ------------------------------------------------------------  Left ventricle:  The cavity size was normal. Wall thickness  was normal. Systolic function was normal. The estimated  ejection fraction was in the range of 55% to 60%. Wall  motion was normal; there were no regional wall motion  abnormalities. Doppler parameters are consistent with   abnormal left ventricular relaxation (grade 1 diastolic  dysfunction).   ------------------------------------------------------------  Aortic valve:   Trileaflet; normal thickness leaflets.  Mobility was not restricted.  Doppler:  Transvalvular  velocity was within the normal range. There was no stenosis.   No regurgitation.   ------------------------------------------------------------  Aorta: Aortic root: The aortic root was normal in size.   ------------------------------------------------------------  Mitral valve:   Structurally normal valve.   Mobility was  not restricted.  Doppler:  Transvalvular velocity was within  the normal range. There was no evidence for stenosis.  No  regurgitation.    Peak gradient: 3mm Hg (D).   ------------------------------------------------------------  Left atrium:  The atrium was normal in size.   ------------------------------------------------------------  Right ventricle:  The cavity size was normal. Systolic  function was normal.   ------------------------------------------------------------  Pulmonic valve:    Doppler:  Transvalvular velocity was  within the normal range. There was no evidence for stenosis.    ------------------------------------------------------------  Tricuspid valve:   Structurally normal valve.    Doppler:  Transvalvular velocity was within the normal range.  Trivial  regurgitation.   ------------------------------------------------------------  Right atrium:  The atrium was normal in size.   ------------------------------------------------------------  Pericardium: There was no pericardial effusion.   ------------------------------------------------------------  Systemic veins:  Inferior vena cava: The vessel was normal in size  EKG:  EKG is *** ordered today.  The ekg ordered today demonstrates ***  Recent Labs: No results found for requested labs within last 365 days.  Recent Lipid Panel     Component Value Date/Time   CHOL 181 06/03/2020 0910   TRIG 123 06/03/2020 0910   HDL 41 06/03/2020 0910   CHOLHDL 4.4 06/03/2020 0910   LDLCALC 118 (H) 06/03/2020 0910    Risk Assessment/Calculations:  {Does this patient have ATRIAL FIBRILLATION?:[id number]}  Home Medications   No outpatient medications have been marked as taking for the 12/01/22 encounter (Appointment) with Sharlene Dory, PA-C.     Review of Systems   ***   All other systems reviewed and are otherwise negative except as noted above.  Physical Exam    VS:  There were no vitals taken for this visit. , BMI There is no height or weight on file to calculate BMI.  Wt Readings from Last 3 Encounters:  12/05/21 199 lb (90.3 kg)  10/11/21 196 lb 4.8 oz (89 kg)  09/02/20 185 lb (83.9 kg)     GEN: Well nourished, well developed, in no acute distress. HEENT: normal. Neck: Supple, no JVD, carotid bruits, or masses. Cardiac: ***RRR, no murmurs, rubs, or gallops. No clubbing, cyanosis, edema.  ***Radials/PT 2+ and equal bilaterally.  Respiratory:  ***Respirations regular and unlabored, clear to auscultation bilaterally. GI: Soft, nontender, nondistended. MS: No deformity or atrophy. Skin: Warm and dry, no rash. Neuro:  Strength and sensation are intact. Psych: Normal affect.  Assessment & Plan    PAF Essential hypertension Mixed  hyperlipidemia  No BP recorded.  {Refresh Note OR Click here to enter BP  :1}***      Disposition: Follow up {follow up:15908} with Christell Constant, MD or APP.  Signed, Sharlene Dory, PA-C 11/30/2022, 8:17 PM [facility]

## 2022-12-01 ENCOUNTER — Ambulatory Visit: Payer: No Typology Code available for payment source | Admitting: Physician Assistant

## 2022-12-01 DIAGNOSIS — I10 Essential (primary) hypertension: Secondary | ICD-10-CM

## 2022-12-01 DIAGNOSIS — E782 Mixed hyperlipidemia: Secondary | ICD-10-CM

## 2022-12-01 DIAGNOSIS — I480 Paroxysmal atrial fibrillation: Secondary | ICD-10-CM

## 2022-12-01 DIAGNOSIS — Z79899 Other long term (current) drug therapy: Secondary | ICD-10-CM

## 2022-12-08 DIAGNOSIS — E6609 Other obesity due to excess calories: Secondary | ICD-10-CM | POA: Diagnosis not present

## 2022-12-08 DIAGNOSIS — Z6834 Body mass index (BMI) 34.0-34.9, adult: Secondary | ICD-10-CM | POA: Diagnosis not present

## 2022-12-08 DIAGNOSIS — Z79899 Other long term (current) drug therapy: Secondary | ICD-10-CM | POA: Diagnosis not present

## 2022-12-08 DIAGNOSIS — Z713 Dietary counseling and surveillance: Secondary | ICD-10-CM | POA: Diagnosis not present

## 2022-12-08 DIAGNOSIS — D75839 Thrombocytosis, unspecified: Secondary | ICD-10-CM | POA: Diagnosis not present

## 2023-01-25 ENCOUNTER — Other Ambulatory Visit: Payer: Self-pay | Admitting: Internal Medicine

## 2023-01-26 NOTE — Telephone Encounter (Signed)
This is a [place] pt.  °

## 2023-01-30 ENCOUNTER — Other Ambulatory Visit: Payer: Self-pay | Admitting: Cardiology

## 2023-04-13 DIAGNOSIS — Z1211 Encounter for screening for malignant neoplasm of colon: Secondary | ICD-10-CM | POA: Diagnosis not present

## 2023-04-13 DIAGNOSIS — E785 Hyperlipidemia, unspecified: Secondary | ICD-10-CM | POA: Diagnosis not present

## 2023-04-13 DIAGNOSIS — F411 Generalized anxiety disorder: Secondary | ICD-10-CM | POA: Diagnosis not present

## 2023-04-13 DIAGNOSIS — Z1239 Encounter for other screening for malignant neoplasm of breast: Secondary | ICD-10-CM | POA: Diagnosis not present

## 2023-04-17 ENCOUNTER — Other Ambulatory Visit: Payer: Self-pay | Admitting: Family Medicine

## 2023-04-17 DIAGNOSIS — Z1231 Encounter for screening mammogram for malignant neoplasm of breast: Secondary | ICD-10-CM

## 2023-10-25 DIAGNOSIS — Z124 Encounter for screening for malignant neoplasm of cervix: Secondary | ICD-10-CM | POA: Diagnosis not present

## 2023-10-25 DIAGNOSIS — E785 Hyperlipidemia, unspecified: Secondary | ICD-10-CM | POA: Diagnosis not present

## 2023-10-25 DIAGNOSIS — Z0000 Encounter for general adult medical examination without abnormal findings: Secondary | ICD-10-CM | POA: Diagnosis not present

## 2023-10-25 DIAGNOSIS — N3941 Urge incontinence: Secondary | ICD-10-CM | POA: Diagnosis not present

## 2023-10-25 DIAGNOSIS — Z1211 Encounter for screening for malignant neoplasm of colon: Secondary | ICD-10-CM | POA: Diagnosis not present

## 2023-10-25 DIAGNOSIS — F411 Generalized anxiety disorder: Secondary | ICD-10-CM | POA: Diagnosis not present

## 2023-10-25 DIAGNOSIS — Z1231 Encounter for screening mammogram for malignant neoplasm of breast: Secondary | ICD-10-CM | POA: Diagnosis not present

## 2023-12-18 DIAGNOSIS — W57XXXA Bitten or stung by nonvenomous insect and other nonvenomous arthropods, initial encounter: Secondary | ICD-10-CM | POA: Diagnosis not present

## 2023-12-18 DIAGNOSIS — S1096XA Insect bite of unspecified part of neck, initial encounter: Secondary | ICD-10-CM | POA: Diagnosis not present

## 2023-12-18 DIAGNOSIS — Z6835 Body mass index (BMI) 35.0-35.9, adult: Secondary | ICD-10-CM | POA: Diagnosis not present
